# Patient Record
Sex: Male | Born: 1965
Health system: Southern US, Community
[De-identification: ages and names within clinical notes are randomized; demographics above are authoritative.]

## PROBLEM LIST (undated history)

## (undated) DIAGNOSIS — T7840XA Allergy, unspecified, initial encounter: Secondary | ICD-10-CM

## (undated) DIAGNOSIS — E781 Pure hyperglyceridemia: Secondary | ICD-10-CM

## (undated) DIAGNOSIS — R739 Hyperglycemia, unspecified: Secondary | ICD-10-CM

## (undated) DIAGNOSIS — E119 Type 2 diabetes mellitus without complications: Secondary | ICD-10-CM

## (undated) HISTORY — DX: Allergy, unspecified, initial encounter: T78.40XA

## (undated) HISTORY — PX: EYE SURGERY: SHX253

## (undated) HISTORY — DX: Hyperglycemia, unspecified: R73.9

## (undated) HISTORY — DX: Type 2 diabetes mellitus without complications: E11.9

## (undated) HISTORY — DX: Pure hyperglyceridemia: E78.1

---
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
## 2009-07-19 ENCOUNTER — Encounter (INDEPENDENT_AMBULATORY_CARE_PROVIDER_SITE_OTHER): Payer: Self-pay | Admitting: *Deleted

## 2009-08-05 ENCOUNTER — Ambulatory Visit: Payer: Self-pay | Admitting: Internal Medicine

## 2009-08-05 DIAGNOSIS — E781 Pure hyperglyceridemia: Secondary | ICD-10-CM | POA: Insufficient documentation

## 2009-08-09 LAB — CONVERTED CEMR LAB
ALT: 26 units/L (ref 0–53)
AST: 25 units/L (ref 0–37)
Albumin: 4.2 g/dL (ref 3.5–5.2)
Alkaline Phosphatase: 84 units/L (ref 39–117)
BUN: 18 mg/dL (ref 6–23)
Basophils Absolute: 0.1 10*3/uL (ref 0.0–0.1)
Basophils Relative: 3 % (ref 0.0–3.0)
Bilirubin, Direct: 0 mg/dL (ref 0.0–0.3)
CO2: 29 meq/L (ref 19–32)
Calcium: 9.1 mg/dL (ref 8.4–10.5)
Chloride: 101 meq/L (ref 96–112)
Cholesterol: 250 mg/dL — ABNORMAL HIGH (ref 0–200)
Creatinine, Ser: 1 mg/dL (ref 0.4–1.5)
Direct LDL: 77 mg/dL
Eosinophils Absolute: 0.2 10*3/uL (ref 0.0–0.7)
Eosinophils Relative: 4.1 % (ref 0.0–5.0)
GFR calc non Af Amer: 87.55 mL/min (ref >60)
Glucose, Bld: 87 mg/dL (ref 70–99)
HCT: 45.9 % (ref 39.0–52.0)
HDL: 37.5 mg/dL — ABNORMAL LOW (ref >39.00)
Hemoglobin: 15.6 g/dL (ref 13.0–17.0)
Lymphocytes Relative: 51.9 % — ABNORMAL HIGH (ref 12.0–46.0)
Lymphs Abs: 2 10*3/uL (ref 0.7–4.0)
MCHC: 34 g/dL (ref 30.0–36.0)
MCV: 91.5 fL (ref 78.0–100.0)
Monocytes Absolute: 0.8 10*3/uL (ref 0.1–1.0)
Monocytes Relative: 19.9 % — ABNORMAL HIGH (ref 3.0–12.0)
Neutro Abs: 0.8 10*3/uL — ABNORMAL LOW (ref 1.4–7.7)
Neutrophils Relative %: 21.1 % — ABNORMAL LOW (ref 43.0–77.0)
Platelets: 158 10*3/uL (ref 150.0–400.0)
Potassium: 4.2 meq/L (ref 3.5–5.1)
RBC: 5.01 M/uL (ref 4.22–5.81)
RDW: 11.9 % (ref 11.5–14.6)
Sodium: 136 meq/L (ref 135–145)
TSH: 0.9 microintl units/mL (ref 0.35–5.50)
Total Bilirubin: 1 mg/dL (ref 0.3–1.2)
Total CHOL/HDL Ratio: 7
Total Protein: 7.3 g/dL (ref 6.0–8.3)
Triglycerides: 708 mg/dL — ABNORMAL HIGH (ref 0.0–149.0)
VLDL: 141.6 mg/dL — ABNORMAL HIGH (ref 0.0–40.0)
WBC: 3.9 10*3/uL — ABNORMAL LOW (ref 4.5–10.5)

## 2009-12-24 ENCOUNTER — Telehealth (INDEPENDENT_AMBULATORY_CARE_PROVIDER_SITE_OTHER): Payer: Self-pay | Admitting: *Deleted

## 2009-12-30 ENCOUNTER — Encounter: Payer: Self-pay | Admitting: Internal Medicine

## 2010-08-13 ENCOUNTER — Encounter: Payer: Self-pay | Admitting: Internal Medicine

## 2010-08-13 ENCOUNTER — Ambulatory Visit: Payer: Self-pay | Admitting: Internal Medicine

## 2010-08-19 LAB — CONVERTED CEMR LAB
ALT: 32 units/L (ref 0–53)
AST: 25 units/L (ref 0–37)
BUN: 16 mg/dL (ref 6–23)
Basophils Absolute: 0 10*3/uL (ref 0.0–0.1)
Basophils Relative: 0.7 % (ref 0.0–3.0)
CO2: 28 meq/L (ref 19–32)
Calcium: 9.7 mg/dL (ref 8.4–10.5)
Chloride: 102 meq/L (ref 96–112)
Creatinine, Ser: 0.9 mg/dL (ref 0.4–1.5)
Eosinophils Absolute: 0.2 10*3/uL (ref 0.0–0.7)
Eosinophils Relative: 3.9 % (ref 0.0–5.0)
GFR calc non Af Amer: 105.09 mL/min (ref >60)
Glucose, Bld: 93 mg/dL (ref 70–99)
HCT: 46.1 % (ref 39.0–52.0)
Hemoglobin: 16.2 g/dL (ref 13.0–17.0)
Lymphocytes Relative: 52.5 % — ABNORMAL HIGH (ref 12.0–46.0)
Lymphs Abs: 2.6 10*3/uL (ref 0.7–4.0)
MCHC: 35.2 g/dL (ref 30.0–36.0)
MCV: 88.3 fL (ref 78.0–100.0)
Monocytes Absolute: 0.6 10*3/uL (ref 0.1–1.0)
Monocytes Relative: 13.1 % — ABNORMAL HIGH (ref 3.0–12.0)
Neutro Abs: 1.5 10*3/uL (ref 1.4–7.7)
Neutrophils Relative %: 29.8 % — ABNORMAL LOW (ref 43.0–77.0)
Platelets: 188 10*3/uL (ref 150.0–400.0)
Potassium: 4.3 meq/L (ref 3.5–5.1)
RBC: 5.22 M/uL (ref 4.22–5.81)
RDW: 12.7 % (ref 11.5–14.6)
Sodium: 138 meq/L (ref 135–145)
WBC: 4.9 10*3/uL (ref 4.5–10.5)

## 2010-09-02 ENCOUNTER — Ambulatory Visit: Payer: Self-pay

## 2010-09-02 ENCOUNTER — Ambulatory Visit: Payer: Self-pay | Admitting: Cardiology

## 2010-10-05 HISTORY — PX: VASECTOMY: SHX75

## 2010-10-10 ENCOUNTER — Telehealth: Payer: Self-pay | Admitting: Internal Medicine

## 2010-10-22 ENCOUNTER — Telehealth: Payer: Self-pay | Admitting: Internal Medicine

## 2010-10-23 ENCOUNTER — Ambulatory Visit
Admission: RE | Admit: 2010-10-23 | Discharge: 2010-10-23 | Payer: Self-pay | Source: Home / Self Care | Attending: Internal Medicine | Admitting: Internal Medicine

## 2010-10-23 ENCOUNTER — Other Ambulatory Visit: Payer: Self-pay | Admitting: Internal Medicine

## 2010-10-23 LAB — LIPID PANEL
Cholesterol: 249 mg/dL — ABNORMAL HIGH (ref 0–200)
HDL: 40.8 mg/dL (ref >39.00)
Total CHOL/HDL Ratio: 6
Triglycerides: 249 mg/dL — ABNORMAL HIGH (ref 0.0–149.0)
VLDL: 49.8 mg/dL — ABNORMAL HIGH (ref 0.0–40.0)

## 2010-10-23 LAB — LDL CHOLESTEROL, DIRECT: Direct LDL: 166.5 mg/dL

## 2010-10-23 LAB — ALT: ALT: 19 U/L (ref 0–53)

## 2010-10-23 LAB — AST: AST: 19 U/L (ref 0–37)

## 2010-11-04 NOTE — Assessment & Plan Note (Signed)
Summary: CPX/drb   Vital Signs:  Patient profile:   45 year old male Height:      69 inches Weight:      206.25 pounds BMI:     30.57 Pulse rate:   73 / minute Pulse rhythm:   regular BP sitting:   126 / 76  (left arm) Cuff size:   regular  Vitals Entered By: Army Fossa CMA (August 13, 2010 3:59 PM) CC: CPX Comments Td,flu shot today [facility] [employer]   History of Present Illness: CPX doing well   Current Medications (verified): 1)  Aspirin 81 Mg Tbec (Aspirin) .Marland Kitchen.. 1 A Day  Allergies (verified): No Known Drug Allergies  Past History:  Past Medical History: Reviewed history from 08/05/2009 and no changes required. hx of high TG, up to 900s - was treated with vytorin (d/c  when he was training for marathon & TG decreased)  Past Surgical History: Reviewed history from 08/05/2009 and no changes required. Tonsillectomy (1976)  Family History: Reviewed history from 08/05/2009 and no changes required. M - deceased intestinal infection, MI colon Ca - no prostate Ca - no Aunt deceased Ca - breast? elevated chol - parents, GP CAD  - M , F (age 55, MI), GP stroke - GP  Social History: Reviewed history from 08/05/2009 and no changes required. Married 2 children from [place]  Former Smoker Alcohol use-yes Drug use-no Regular exercise-yes needs to improve diet  Review of Systems General:  Denies fatigue and fever; wt has increase and decreased throughout the year. CV:  Denies chest pain or discomfort and swelling of feet. Resp:  Denies cough and shortness of breath. GI:  Denies bloody stools, diarrhea, nausea, and vomiting. GU:  Denies dysuria and hematuria. Psych:  Denies anxiety and depression.  Physical Exam  General:  alert, well-developed, and well-nourished.   Neck:  no thyromegaly.   Lungs:  normal respiratory effort, no intercostal retractions, no accessory muscle use, and normal breath sounds.   Heart:  normal rate, regular rhythm, and no  murmur.   Abdomen:  soft, non-tender, normal bowel sounds, no distention, no masses, and no guarding.   Extremities:  no pretibial edema bilaterally  Psych:  Oriented X3, memory intact for recent and remote, normally interactive, good eye contact, not anxious appearing, and not depressed appearing.     Impression & Recommendations:  Problem # 1:  HEALTH SCREENING (ICD-V70.0) Td today flu shot today diet-exercise discussed  labs  EKG--nsr  strong FH CAD and elevated TG--- patient request a stress test , will do  Orders: Venipuncture (96045) Specimen Handling (40981) T-NMR, Lipoprofile (19147-82956) TLB-CBC Platelet - w/Differential (85025-CBCD) TLB-BMP (Basic Metabolic Panel-BMET) (80048-METABOL) TLB-ALT (SGPT) (84460-ALT) TLB-AST (SGOT) (84450-SGOT) EKG w/ Interpretation (93000)  Problem # 2:  FAMILY HISTORY OF ISCHEMIC HEART DISEASE (ICD-V17.3) see #1   Orders: Cardiology Referral (Cardiology)  Problem # 3:  HYPERTRIGLYCERIDEMIA (ICD-272.1) patient took fenofibrate x few weeks after last OV (2010) w/o problems but self d/c now has been on vytorin 10-20 (leftover from previous doctor) x several months, takes  ~ 4 times a week d/w patient that -- TG at that level increase risk of CAD, pancreatitis. --Diet-exercise will help decrease TG --excessive ETOH will increase TG NMR ordered consider referal to the cholesterol clinic  The following medications were removed from the medication list:    Lipofen 150 Mg Caps (Fenofibrate) ..... One by mouth daily His updated medication list for this problem includes:    Vytorin 10-20 Mg Tabs (  Ezetimibe-simvastatin) .Marland Kitchen... 1 a day  Complete Medication List: 1)  Aspirin 81 Mg Tbec (Aspirin) .Marland Kitchen.. 1 a day 2)  Vytorin 10-20 Mg Tabs (Ezetimibe-simvastatin) .Marland Kitchen.. 1 a day  Other Orders: Admin 1st Vaccine (44034) Flu Vaccine 43yrs + (74259) Tdap => 19yrs IM (56387) Admin of Any Addtl Vaccine (56433)  Patient Instructions: 1)  Please  schedule a follow-up appointment in 6 months .  Prescriptions: VYTORIN 10-20 MG TABS (EZETIMIBE-SIMVASTATIN) 1 a day  #90 x 1   Entered and Authorized by:   Jose E. Paz MD   Signed by:   Nolon Rod. Paz MD on 08/13/2010   Method used:   Print then Give to Patient   RxID:   [id number]    Orders Added: 1)  Venipuncture [01093] 2)  Specimen Handling [99000] 3)  T-NMR, Lipoprofile [[id number]] 4)  TLB-CBC Platelet - w/Differential [85025-CBCD] 5)  TLB-BMP (Basic Metabolic Panel-BMET) [80048-METABOL] 6)  TLB-ALT (SGPT) [84460-ALT] 7)  TLB-AST (SGOT) [84450-SGOT] 8)  Admin 1st Vaccine [90471] 9)  Flu Vaccine 22yrs + [90658] 10)  Tdap => 5yrs IM [90715] 11)  Admin of Any Addtl Vaccine [90472] 12)  EKG w/ Interpretation [93000] 13)  Cardiology Referral [Cardiology] 14)  Est. Patient age 60-64 [61]  Flu Vaccine Consent Questions     Do you have a history of severe allergic reactions to this vaccine? no    Any prior history of allergic reactions to egg and/or gelatin? no    Do you have a sensitivity to the preservative Thimersol? no    Do you have a past history of Guillan-Barre Syndrome? no    Do you currently have an acute febrile illness? no    Have you ever had a severe reaction to latex? no    Vaccine information given and explained to patient? yes    Are you currently pregnant? no    Lot Number:[id number]   Exp Date:04/04/2011   Site Given  Left Deltoid IM  Immunizations Administered:  Tetanus Vaccine:    Vaccine Type: Tdap    Site: right deltoid    Mfr: GlaxoSmithKline    Dose: 0.5 ml    Route: IM    Given by: Army Fossa CMA    Exp. Date: 07/24/2012    Lot #: [id number]   Immunizations Administered:  Tetanus Vaccine:    Vaccine Type: Tdap    Site: right deltoid    Mfr: GlaxoSmithKline    Dose: 0.5 ml    Route: IM    Given by: Army Fossa CMA    Exp. Date: 07/24/2012    Lot #: [id number]   .lbflu1    Risk Factors:  Alcohol use:   yes

## 2010-11-04 NOTE — Progress Notes (Signed)
----   Converted from flag ---- ---- 12/24/2009 9:32 AM, Elita Quick E. Paz MD wrote: please send a referral to ophthalmology, Dr. Hazle Quant office  dx : Eye discomfort history of previous eye  surgery just  send referral, patient will call and schedule ------------------------------

## 2010-11-04 NOTE — Consult Note (Signed)
Summary: [facility]   Imported By: Lanelle Bal 01/08/2010 12:06:16  _____________________________________________________________________  External Attachment:    Type:   Image     Comment:   External Document

## 2010-11-06 NOTE — Progress Notes (Signed)
Summary: call in lipofen 150  Phone Note Outgoing Call   Summary of Call: spoke with patient, aware he needs a followup  FLP, AST ALT. Patient will schedule please  call lipofen 150mg  once daily #30, 2 RFs to [facility] [address] [employer]. Zykeria Laguardia E. Chayna Surratt MD  October 10, 2010 12:34 PM        New/Updated Medications: LIPOFEN 150 MG CAPS (FENOFIBRATE) 1 by mouth daily. Prescriptions: LIPOFEN 150 MG CAPS (FENOFIBRATE) 1 by mouth daily.  #30 x 2   Entered by:   Army Fossa CMA   Authorized by:   Nolon Rod. Selwyn Reason MD   Signed by:   Army Fossa CMA on 10/10/2010   Method used:   Electronically to        [facility]. [address] [phone]* (retail)       [address]       [place], [place]  [address]       Ph: [phone] or [phone]       Fax: [phone]   RxID:   [id number]

## 2010-11-06 NOTE — Progress Notes (Signed)
Summary: labs  Phone Note Outgoing Call   Summary of Call: patient due for a FLP, AST, ALT. He will call shortly and get an appointment for blood work only. DX hyperlipidemia Dan Scearce E. Meliyah Simon MD  October 22, 2010 3:01 PM      Appended Document: labs Patient called back, patient will have labs tomorrow at [facility]

## 2011-01-23 ENCOUNTER — Other Ambulatory Visit: Payer: Self-pay | Admitting: Internal Medicine

## 2011-01-23 MED ORDER — FENOFIBRATE 150 MG PO CAPS: ORAL_CAPSULE | ORAL | Status: DC

## 2011-01-23 NOTE — Telephone Encounter (Signed)
Please send a refill for lipofen , #30, 3 RF. He will later on call for blood work: FLP, AST, ALT, DX hyperlipidemia [facility]

## 2011-04-03 ENCOUNTER — Telehealth: Payer: Self-pay | Admitting: Internal Medicine

## 2011-04-03 DIAGNOSIS — L282 Other prurigo: Secondary | ICD-10-CM

## 2011-04-03 NOTE — Telephone Encounter (Signed)
Spoke w/ pt, has a quite pruritic ras, Px prednisone , needs to see dem Please arrange dermatology referral, see if they can see him next week

## 2011-04-14 ENCOUNTER — Encounter: Payer: Self-pay | Admitting: Physician Assistant

## 2011-07-08 ENCOUNTER — Encounter: Payer: Self-pay | Admitting: Internal Medicine

## 2011-07-08 ENCOUNTER — Ambulatory Visit (INDEPENDENT_AMBULATORY_CARE_PROVIDER_SITE_OTHER): Payer: PRIVATE HEALTH INSURANCE | Admitting: Internal Medicine

## 2011-07-08 DIAGNOSIS — K649 Unspecified hemorrhoids: Secondary | ICD-10-CM | POA: Insufficient documentation

## 2011-07-08 DIAGNOSIS — E781 Pure hyperglyceridemia: Secondary | ICD-10-CM

## 2011-07-08 LAB — LIPID PANEL: Cholesterol: 211 mg/dL — ABNORMAL HIGH (ref 0–200)

## 2011-07-08 NOTE — Assessment & Plan Note (Signed)
Patient has an asymptomatic hemorrhoid, recommend to continue with a healthy diet (he eats lots of fruits and vegetables), also Metamucil 2 capsules a day, hydrocortisone 1% over-the-counter cream if needed. If he developed pain-swelling : he needs to let me know.

## 2011-07-08 NOTE — Progress Notes (Signed)
  Subjective:    Patient ID: Robert Soto, male    DOB: 1966/01/23, 45 y.o.   MRN: [id number]  HPI Acute office visit 10 days ago noticed a "knot" at the anus. Denies any pain, bleeding or discharge in the area. Also would like his cholesterol to be checked  Past Medical History  Diagnosis Date  . High triglycerides     hx. up to 900s- was treated with vytorin (d/c when he was training for marathon & TG decreased)   Past Surgical History  Procedure Date  . Tonsillectomy 1976    Review of Systems No abdominal pain, nausea, vomiting. No constipation. Patient is training for a marathon, has noted his stools to be smaller than usual.     Objective:   Physical Exam  Constitutional: He is oriented to person, place, and time. He appears well-developed and well-nourished. No distress.  Genitourinary:       External inspection shows a 1 cm, soft, nontender hemorrhoid at around 6:00. Few skin tags as well. Digital rectal exam with normal rectum and prostate  Musculoskeletal: He exhibits no edema.  Neurological: He is alert and oriented to person, place, and time.  Skin: He is not diaphoretic.          Assessment & Plan:  Visit was performed when the computers were down, documentation entered after the visit

## 2011-07-08 NOTE — Assessment & Plan Note (Addendum)
Currently on no medication, he is training for a marathon, doing an excellent diet and a lot of exercise, run 20 miles yesterday. Labs

## 2011-08-25 ENCOUNTER — Telehealth: Payer: Self-pay | Admitting: Internal Medicine

## 2011-08-25 DIAGNOSIS — E781 Pure hyperglyceridemia: Secondary | ICD-10-CM

## 2011-08-25 NOTE — Telephone Encounter (Signed)
Patient would like to be evaluated  at the lipid clinic, referral made

## 2011-08-31 ENCOUNTER — Ambulatory Visit: Payer: PRIVATE HEALTH INSURANCE

## 2011-09-04 ENCOUNTER — Ambulatory Visit (INDEPENDENT_AMBULATORY_CARE_PROVIDER_SITE_OTHER): Payer: PRIVATE HEALTH INSURANCE | Admitting: Internal Medicine

## 2011-09-04 ENCOUNTER — Encounter: Payer: Self-pay | Admitting: Internal Medicine

## 2011-09-04 VITALS — BP 120/84 | HR 77 | Temp 98.6°F | Wt 198.0 lb

## 2011-09-04 DIAGNOSIS — R059 Cough, unspecified: Secondary | ICD-10-CM

## 2011-09-04 DIAGNOSIS — R05 Cough: Secondary | ICD-10-CM

## 2011-09-04 DIAGNOSIS — B349 Viral infection, unspecified: Secondary | ICD-10-CM

## 2011-09-04 MED ORDER — AZITHROMYCIN 250 MG PO TABS: ORAL_TABLET | ORAL | Status: AC

## 2011-09-04 NOTE — Progress Notes (Signed)
  Subjective:    Patient ID: Robert Soto, male    DOB: [birth year], 45 y.o.   MRN: [id number]  HPI Symptoms is that of 5 days ago, he is coughing  abundant green sputum. Is taking ibuprofen and Robitussin. He had to skip work a couple of days this week  Past Medical History  Diagnosis Date  . High triglycerides     hx. up to 900s- was treated with vytorin (d/c when he was training for marathon & TG decreased)     Review of Systems Denies fever or chills No nausea vomiting, some diarrhea yesterday. Some myalgias, feeling fatigue, mild global headache. Mild sore throat    Objective:   Physical Exam  Constitutional: He is oriented to person, place, and time. He appears well-developed and well-nourished.  HENT:  Head: Normocephalic and atraumatic.  Right Ear: External ear normal.  Left Ear: External ear normal.  Nose: Nose normal.       Face is symmetric, not tender to palpation. Throat without redness or discharge, it looks symmetric  Cardiovascular: Normal rate, regular rhythm and normal heart sounds.   No murmur heard. Pulmonary/Chest: Effort normal and breath sounds normal. No respiratory distress. He has no wheezes. He has no rales.  Musculoskeletal: He exhibits no edema.  Neurological: He is alert and oriented to person, place, and time.      Assessment & Plan:  Patient presents with what seems to be a viral syndrome complicated with bronchitis. See instructions

## 2011-09-04 NOTE — Patient Instructions (Signed)
Rest, fluids , tylenol For cough, take Mucinex DM twice a day as needed  For congestion use Sudafed (pseudoephedrine) behind the counter 30 mg every 4 to 6 hours as needed Take the antibiotic as prescribed ----> zithromax Call if no better in few days Call anytime if the symptoms are severe, you have high fever

## 2011-09-21 ENCOUNTER — Ambulatory Visit (INDEPENDENT_AMBULATORY_CARE_PROVIDER_SITE_OTHER): Payer: PRIVATE HEALTH INSURANCE | Admitting: Pharmacist

## 2011-09-21 DIAGNOSIS — E781 Pure hyperglyceridemia: Secondary | ICD-10-CM

## 2011-09-22 ENCOUNTER — Other Ambulatory Visit: Payer: PRIVATE HEALTH INSURANCE

## 2011-09-22 DIAGNOSIS — E781 Pure hyperglyceridemia: Secondary | ICD-10-CM

## 2011-09-24 LAB — NMR LIPOPROFILE WITH LIPIDS
Cholesterol, Total: 189 mg/dL (ref <200)
HDL Particle Number: 31.4 umol/L (ref >=30.5)
HDL-C: 44 mg/dL (ref >=40)
Triglycerides: 573 mg/dL — ABNORMAL HIGH (ref <150)

## 2011-09-24 NOTE — Progress Notes (Signed)
HPI [name] is a 45 yo M who presents to Lipid Clinic for initial evaluation.  He requested referral due to his longstanding history of hypertriglyceridemia as well as a strong family history of CAD.  His last lipid panel was normal, but he brought a copy of his labwork over the past 20 years for review.  During this time, his TG have fluctuated from 100s to 700s.  This fluctuation is usually related to his training schedule.  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  He has tried Lipofen and Vytorin in the past to treat his TG.  Neither caused any problems and did have an effect on his cholesterol, he just does not like to stay on medication long term.  He is currently taking 1 fish oil daily.   Reviewed his family history.  His father died of MI in his 38s.  Mother died of MI likely secondary to infection at 42.  His brother was diagnosed with hyperlipidemia at age 21.  His dad was 1 of 12 children and all of his siblings have either had an MI or have heart disease.  Pt does have 2 young children.  They have both had their cholesterol tested and it is normal.    Pt does not smoke.  He does drink wine or scotch socially.  He does a lot of international travel for his job.    Review of diet shows pt does a fairly decent job at controlling his diet.  For breakfast he will have oatmeal and a banana.  During the morning, he may snack on a breakfast bar.  He often brings lunch from home but will have the occasional business lunch to attend.  For dinner, he will have fish 1-2 days per week, chicken, salads, and occasional rice.  His children do like to have pizza every Friday night.    Exercise consists of training for marathons.  Right now, pt is not training so he does not run on a daily basis.   Current Outpatient Prescriptions  Medication Sig Dispense Refill  . fish oil-omega-3 fatty acids 1000 MG capsule Take 1 g by mouth daily.          No Known Allergies

## 2011-09-28 NOTE — Assessment & Plan Note (Signed)
Pt's cholesterol controlled in October, but repeat test in December showed increased TG (573- goal<150).  We checked lipoprofile to help determine if we should treat with Niacin or fibrate.  LDL- P- 2822 (goal<1000) and LDL size was 19.5- which represents small particle size.  Given increase in LDL-P, will start Niacin 500mg  once daily rather than a fibrate.  Will f/u in 1-2 months.

## 2011-11-18 ENCOUNTER — Other Ambulatory Visit: Payer: Self-pay | Admitting: Pharmacist

## 2011-11-18 DIAGNOSIS — Z7901 Long term (current) use of anticoagulants: Secondary | ICD-10-CM

## 2011-11-18 DIAGNOSIS — E785 Hyperlipidemia, unspecified: Secondary | ICD-10-CM

## 2011-12-09 ENCOUNTER — Other Ambulatory Visit (INDEPENDENT_AMBULATORY_CARE_PROVIDER_SITE_OTHER): Payer: PRIVATE HEALTH INSURANCE

## 2011-12-09 DIAGNOSIS — E785 Hyperlipidemia, unspecified: Secondary | ICD-10-CM

## 2011-12-09 DIAGNOSIS — Z7901 Long term (current) use of anticoagulants: Secondary | ICD-10-CM

## 2011-12-09 LAB — HEPATIC FUNCTION PANEL
ALT: 24 U/L (ref 0–53)
Albumin: 4.3 g/dL (ref 3.5–5.2)
Alkaline Phosphatase: 68 U/L (ref 39–117)
Total Protein: 7.3 g/dL (ref 6.0–8.3)

## 2011-12-12 LAB — NMR LIPOPROFILE WITH LIPIDS
Cholesterol, Total: 274 mg/dL — ABNORMAL HIGH (ref <200)
HDL-C: 37 mg/dL — ABNORMAL LOW (ref >=40)
LDL Size: 20.4 nm — ABNORMAL LOW (ref >20.5)
Small LDL Particle Number: 1168 nmol/L — ABNORMAL HIGH (ref <=527)
Triglycerides: 1156 mg/dL — ABNORMAL HIGH (ref <150)

## 2011-12-17 ENCOUNTER — Ambulatory Visit (INDEPENDENT_AMBULATORY_CARE_PROVIDER_SITE_OTHER): Payer: PRIVATE HEALTH INSURANCE | Admitting: Pharmacist

## 2011-12-17 DIAGNOSIS — E781 Pure hyperglyceridemia: Secondary | ICD-10-CM

## 2011-12-17 MED ORDER — NIACIN ER (ANTIHYPERLIPIDEMIC) 1000 MG PO TBCR: 1000.0000 mg | EXTENDED_RELEASE_TABLET | Freq: Every day | ORAL | Status: DC

## 2011-12-17 MED ORDER — FENOFIBRATE 150 MG PO CAPS: 150.0000 mg | ORAL_CAPSULE | Freq: Every day | ORAL | Status: DC

## 2011-12-17 MED ORDER — FENOFIBRATE 150 MG PO CAPS
1.0000 | ORAL_CAPSULE | Freq: Every day | ORAL | Status: DC
Start: 2011-12-17 — End: 2011-12-17

## 2011-12-17 NOTE — Progress Notes (Signed)
Subjective: Robert Soto presented in good spirits today for 3 month follow up on his lipids.  He has a history of fluctuating TG (100-700s) related to his marathon training schedule. 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  At his last visit, he was started on Niaspan 500mg  daily, and continues this without reports of side effects.  Of note, he has significant family history of CVD in both parents.  Diet - He continues to maintain a healthy diet, but occasionally attends business lunches and dinners.  He avoids fried foods and tries to have salad with each meal.  Exercise - He is currently not training for any marathons, but is hoping to get more active in the spring.  His "off season" schedule is usually to run 3 times/week.  NMR Lipoprofile LDL-P  1575 (12/09/11) HDL-C  37 (12/09/11)  TG  1156 (12/09/11) TC  274 (12/09/11) LDL Size 20.4 (12/09/11)   Current Outpatient Prescriptions  Medication Sig Dispense Refill  . fish oil-omega-3 fatty acids 1000 MG capsule Take 1-2 g by mouth daily.       . niacin (NIASPAN) 500 MG CR tablet Take 500 mg by mouth at bedtime.

## 2011-12-17 NOTE — Assessment & Plan Note (Addendum)
CV Risk Assessment  Risk Factors: Family history of MI in mother & father, low HDL LDL Goal<130, HDL Goal>40, TG Goal<150, ZOX-W<9604  Recommendations and Plan  1. Increase Niaspan to 1000mg  once daily.   2. Start taking Lipofen 150mg  once daily. 3. Continue to maintain a heart healthy diet. 4. Follow-up for labwork and visit in 6 weeks.

## 2011-12-17 NOTE — Patient Instructions (Addendum)
Increase Niaspan to 1000mg  once daily.    Start taking Lipofen 150mg  once daily.  Continue to maintain a heart healthy diet.  Follow-up for labwork and visit in 6 weeks.

## 2011-12-29 ENCOUNTER — Other Ambulatory Visit: Payer: Self-pay | Admitting: Pharmacist

## 2011-12-29 DIAGNOSIS — E781 Pure hyperglyceridemia: Secondary | ICD-10-CM

## 2012-01-27 ENCOUNTER — Other Ambulatory Visit: Payer: PRIVATE HEALTH INSURANCE

## 2012-01-28 ENCOUNTER — Ambulatory Visit: Payer: PRIVATE HEALTH INSURANCE

## 2012-02-01 ENCOUNTER — Other Ambulatory Visit (INDEPENDENT_AMBULATORY_CARE_PROVIDER_SITE_OTHER): Payer: PRIVATE HEALTH INSURANCE

## 2012-02-01 DIAGNOSIS — E781 Pure hyperglyceridemia: Secondary | ICD-10-CM

## 2012-02-01 LAB — HEPATIC FUNCTION PANEL
ALT: 20 U/L (ref 0–53)
AST: 22 U/L (ref 0–37)
Bilirubin, Direct: 0.1 mg/dL (ref 0.0–0.3)
Total Protein: 7.3 g/dL (ref 6.0–8.3)

## 2012-02-01 LAB — LIPID PANEL
HDL: 45.6 mg/dL (ref >39.00)
Triglycerides: 437 mg/dL — ABNORMAL HIGH (ref 0.0–149.0)

## 2012-02-02 ENCOUNTER — Ambulatory Visit (INDEPENDENT_AMBULATORY_CARE_PROVIDER_SITE_OTHER): Payer: PRIVATE HEALTH INSURANCE | Admitting: Pharmacist

## 2012-02-02 DIAGNOSIS — E785 Hyperlipidemia, unspecified: Secondary | ICD-10-CM

## 2012-02-02 DIAGNOSIS — E781 Pure hyperglyceridemia: Secondary | ICD-10-CM

## 2012-02-02 NOTE — Progress Notes (Signed)
Subjective: Robert Soto presented in for 1 month follow up on his lipids.  He has a history of fluctuating TG (100-700s). 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  He recently completed a 3 week stent of travel so admits that his diet and exercise routine have been the worst they have been in the past year.   At his last visit, he was started on fenofibrate and Niaspan was increased from 500mg  to 1000mg .  He has not been compliant with taking the fish oil every day.  Of note, he has significant family history of CVD in both parents.  Diet - He continues to maintain a healthy diet, but occasionally attends business lunches and dinners.  He avoids fried foods and tries to have salad with each meal.  Exercise - He is currently not training for any marathons, but is hoping to get more active in the spring.  His "off season" schedule is usually to run 3 times/week.    Current Outpatient Prescriptions  Medication Sig Dispense Refill  . Fenofibrate (LIPOFEN) 150 MG CAPS Take 1 capsule (150 mg total) by mouth daily.  30 each  6  . fish oil-omega-3 fatty acids 1000 MG capsule Take 1-2 g by mouth daily.       . niacin (NIASPAN) 1000 MG CR tablet Take 1 tablet (1,000 mg total) by mouth at bedtime.  30 tablet  6

## 2012-02-02 NOTE — Assessment & Plan Note (Signed)
Pt's TG improved with addition of fenofibrate.  TC- 252 (goal<200), TG- 437 (goal<150), HDL- 45 (goal>40), LDL- 124 (goal<130).  LFTs are WNL.  Will have patient increase fish oil to 4gm per day.  He is concerned over traveling and his TG increasing when traveling.  He is going to try to increase Niacin to 1500mg  daily.  If he can tolerate this, he will increase niacin while traveling then decrease back to 1000mg  daily while at home and able to run on a regular basis.  His goal is to weigh <190lb by next visit.  Will f/u in June after his next international travel trip.

## 2012-02-02 NOTE — Patient Instructions (Addendum)
Increase fish oil to 4 capsules per day.   Try to take 1500mg  of niacin this week.  If you can tolerate with no side effects, feel free to increase Niacin when you will be traveling and unable to eat your normal diet or exercise on a regular basis   We will recheck your labs in June.

## 2012-03-30 ENCOUNTER — Other Ambulatory Visit: Payer: PRIVATE HEALTH INSURANCE

## 2012-04-01 ENCOUNTER — Ambulatory Visit: Payer: PRIVATE HEALTH INSURANCE | Admitting: Pharmacist

## 2012-06-15 ENCOUNTER — Ambulatory Visit: Payer: PRIVATE HEALTH INSURANCE

## 2012-06-15 DIAGNOSIS — E785 Hyperlipidemia, unspecified: Secondary | ICD-10-CM

## 2012-06-15 DIAGNOSIS — Z79899 Other long term (current) drug therapy: Secondary | ICD-10-CM

## 2012-06-15 LAB — HEPATIC FUNCTION PANEL
ALT: 21 U/L (ref 0–53)
AST: 20 U/L (ref 0–37)
Albumin: 4.4 g/dL (ref 3.5–5.2)
Total Bilirubin: 0.7 mg/dL (ref 0.3–1.2)

## 2012-06-15 LAB — LIPID PANEL
Cholesterol: 259 mg/dL — ABNORMAL HIGH (ref 0–200)
Total CHOL/HDL Ratio: 5
Triglycerides: 331 mg/dL — ABNORMAL HIGH (ref 0.0–149.0)

## 2012-06-17 ENCOUNTER — Encounter: Payer: Self-pay | Admitting: Pharmacist

## 2012-06-17 ENCOUNTER — Ambulatory Visit (INDEPENDENT_AMBULATORY_CARE_PROVIDER_SITE_OTHER): Payer: PRIVATE HEALTH INSURANCE | Admitting: Pharmacist

## 2012-06-17 VITALS — Wt 205.0 lb

## 2012-06-17 DIAGNOSIS — E781 Pure hyperglyceridemia: Secondary | ICD-10-CM

## 2012-06-17 MED ORDER — FENOFIBRATE 160 MG PO TABS: 160.0000 mg | ORAL_TABLET | Freq: Every day | ORAL | Status: DC

## 2012-06-17 NOTE — Progress Notes (Signed)
Subjective: Mr. Raben presented in for 3 month follow up on his lipids.  He has a history of fluctuating TG (100-700s). 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  He has significant family history of CVD in both parents.  He is currently taking fenofibrate, fish oil, and Niaspan.  He did report a few incidences of flushing but only when he took the medication before bedtime.   Diet - He continues to maintain a healthy diet, but occasionally attends business lunches and dinners.  He avoids fried foods and tries to have salad with each meal.  Exercise - He is currently not training for any marathons, but wants to start training for smaller races (10k, 1/2 marathons).  He recently started a weight loss competition with a group at work and has lost several pounds just from portion control.    Current Outpatient Prescriptions  Medication Sig Dispense Refill  . niacin (NIASPAN) 1000 MG CR tablet Take 1 tablet (1,000 mg total) by mouth at bedtime.  30 tablet  6  . fenofibrate 160 MG tablet Take 1 tablet (160 mg total) by mouth daily. with food  90 tablet  3  . fish oil-omega-3 fatty acids 1000 MG capsule Take 1-2 g by mouth daily.       Marland Kitchen DISCONTD: Fenofibrate (LIPOFEN) 150 MG CAPS Take 1 capsule (150 mg total) by mouth daily.  30 each  6

## 2012-06-17 NOTE — Patient Instructions (Addendum)
Stop taking fish oil at this time  Start taking the generic fenofibrate once a day with a meal  You can start taking a baby aspirin 30 minutes before the niacin to decrease the flushing  Continue a healthy diet and exercise regimen

## 2012-06-20 NOTE — Assessment & Plan Note (Signed)
Pt's TG improved but LDL increased. TC- 259 (goal<200), TG-331 (goal<150), HDL- 48 (goal>40), LDL- 143 (goal<130).  LFTs are WNL.  Will continue current medications.  Pt asked for cheaper alternatives so will change to generic fenofibrate.  Suggested trying ASA 81mg  30 min prior to Niacin to prevent flushing.  As patient increases his activity level, will hope that TG and LDL will improve.  Will follow up in 6-8 months.

## 2012-11-19 ENCOUNTER — Telehealth: Payer: Self-pay | Admitting: Internal Medicine

## 2012-11-21 NOTE — Telephone Encounter (Signed)
Pt has not been seen within a year. OK to refill? 

## 2012-11-21 NOTE — Telephone Encounter (Signed)
I called in the RF during the weekend,#30 x 12; he sees the lipid clinic

## 2012-12-19 ENCOUNTER — Telehealth: Payer: Self-pay | Admitting: Internal Medicine

## 2012-12-19 MED ORDER — AMOXICILLIN-POT CLAVULANATE 875-125 MG PO TABS: 1.0000 | ORAL_TABLET | Freq: Two times a day (BID) | ORAL | Status: DC

## 2012-12-19 NOTE — Telephone Encounter (Signed)
Phone call from patient: Was taking at bike trip yesterday near [place],  [place] and a dog bite him in two other people. The wound is looking fine but he's still concerned. The dog has a owner, last rabies shot 10-2010. [place] Animal control is taking care of the dog x the next few days. Plan: Antibiotics for 10 days We'll discuss with ID, further treatment needed

## 2012-12-21 NOTE — Telephone Encounter (Signed)
Spoke w/ ID, appreciate her input: ID agrees w/ no rabies vaccine as long as the dog remains healthy x 10 days. Will communicate this info to the patient and advise him to check on the dog in the next 10 days

## 2012-12-29 ENCOUNTER — Encounter: Payer: Self-pay | Admitting: Pharmacist

## 2013-02-09 ENCOUNTER — Telehealth: Payer: Self-pay | Admitting: Internal Medicine

## 2013-04-20 NOTE — Telephone Encounter (Signed)
Error

## 2013-05-03 ENCOUNTER — Telehealth: Payer: Self-pay | Admitting: *Deleted

## 2013-05-03 DIAGNOSIS — Z0000 Encounter for general adult medical examination without abnormal findings: Secondary | ICD-10-CM

## 2013-05-03 NOTE — Telephone Encounter (Signed)
Orders placed.

## 2013-05-03 NOTE — Telephone Encounter (Signed)
Message copied by Shirlee More I on Wed May 03, 2013  1:20 PM ------      Message from: Willow Ora E      Created: Wed May 03, 2013 12:52 PM      Regarding: please enter orders        Please enter the following orders:      CMP, CBC, TSH, FLP--- dx V70      To be done tomorrow at the [facility] office  ------

## 2013-05-04 ENCOUNTER — Other Ambulatory Visit (INDEPENDENT_AMBULATORY_CARE_PROVIDER_SITE_OTHER): Payer: Managed Care, Other (non HMO)

## 2013-05-04 ENCOUNTER — Telehealth: Payer: Self-pay | Admitting: *Deleted

## 2013-05-04 DIAGNOSIS — Z0000 Encounter for general adult medical examination without abnormal findings: Secondary | ICD-10-CM

## 2013-05-04 LAB — LIPID PANEL
Cholesterol: 283 mg/dL — ABNORMAL HIGH (ref 0–200)
HDL: 41.9 mg/dL (ref >39.00)
Total CHOL/HDL Ratio: 7
Triglycerides: 530 mg/dL — ABNORMAL HIGH (ref 0.0–149.0)
VLDL: 106 mg/dL — ABNORMAL HIGH (ref 0.0–40.0)

## 2013-05-04 LAB — COMPREHENSIVE METABOLIC PANEL
AST: 21 U/L (ref 0–37)
Albumin: 4.4 g/dL (ref 3.5–5.2)
BUN: 21 mg/dL (ref 6–23)
CO2: 25 mEq/L (ref 19–32)
Calcium: 9.7 mg/dL (ref 8.4–10.5)
Chloride: 104 mEq/L (ref 96–112)
Creatinine, Ser: 1 mg/dL (ref 0.4–1.5)
GFR: 88.04 mL/min (ref >60.00)
Glucose, Bld: 103 mg/dL — ABNORMAL HIGH (ref 70–99)
Potassium: 4.3 mEq/L (ref 3.5–5.1)

## 2013-05-04 LAB — CBC
Platelets: 279 10*3/uL (ref 150.0–400.0)
RBC: 5.07 Mil/uL (ref 4.22–5.81)
WBC: 8.8 10*3/uL (ref 4.5–10.5)

## 2013-05-04 LAB — TSH: TSH: 1.29 u[IU]/mL (ref 0.35–5.50)

## 2013-05-04 NOTE — Telephone Encounter (Signed)
[facility] Triage Call Report Triage Record Num: [id number] Operator: Chevis Pretty Patient Name: Robert Soto Call Date & Time: 05/03/2013 3:33:34PM Patient Phone: [phone] PCP: Nolon Rod. Paz Patient Gender: Male PCP Fax : Patient DOB: 1966-07-06 Practice Name: [facility] Reason for Call: Caller: [name]/Patient; PCP: Other; CB#: [phone]; Call regarding Patient Calling To See If Labwork Ordered for 05/04/13 At [facility]. Per Epic, labwork ordered; patient will come in for fasting labs in AM 05/04/13. krs/can Protocol(s) Used: Office Note Recommended Outcome per Protocol: Information Noted and Sent to Office Reason for Outcome: Caller information to office Care Advice: ~ 07/

## 2013-05-05 ENCOUNTER — Ambulatory Visit (INDEPENDENT_AMBULATORY_CARE_PROVIDER_SITE_OTHER): Payer: Managed Care, Other (non HMO) | Admitting: Internal Medicine

## 2013-05-05 ENCOUNTER — Encounter: Payer: Self-pay | Admitting: Internal Medicine

## 2013-05-05 VITALS — BP 115/80 | HR 56 | Temp 97.8°F | Wt 213.8 lb

## 2013-05-05 DIAGNOSIS — Z0000 Encounter for general adult medical examination without abnormal findings: Secondary | ICD-10-CM | POA: Insufficient documentation

## 2013-05-05 DIAGNOSIS — E781 Pure hyperglyceridemia: Secondary | ICD-10-CM

## 2013-05-05 NOTE — Assessment & Plan Note (Addendum)
previous FLPs reviewed with the patient, he has been taking fenofibrate and niacin consistently, ran out of fish oil about 2 weeks ago. He has a ++ family history of heart disease, his triglycerides are elevated despite medication, LDL is ~140. I recommend LDL goal 100. Will discuss with the chol clinic and contact the patient next week. Discussed with the lipid clinic, I suggested Lipitor 20 mg one by mouth daily, they agreed. Advise patient to call the lipid clinic to be seen in 2 months for followup

## 2013-05-05 NOTE — Patient Instructions (Addendum)
Start taking an aspirin 81 mg daily I will talk with a cholesterol clinic andlet you know next week about the next step. Next visit in 1 year

## 2013-05-05 NOTE — Progress Notes (Signed)
  Subjective:    Patient ID: Robert Soto, male    DOB: 23-Jul-1966, 47 y.o.   MRN: [id number]  HPI CPX  Past Medical History  Diagnosis Date  . High triglycerides     hx. up to 900s- was treated with vytorin (d/c when he was training for marathon & TG decreased)   Past Surgical History  Procedure Laterality Date  . Tonsillectomy  1976  . Vasectomy  2012   History   Social History  . Marital Status: Married    Spouse Name: N/A    Number of Children: 2  . Years of Education: N/A   Occupational History  . managment    Social History Main Topics  . Smoking status: Former Games developer  . Smokeless tobacco: Never Used  . Alcohol Use: Yes     Comment: socially   . Drug Use: No  . Sexually Active: Not on file   Other Topics Concern  . Not on file   Social History Narrative  . No narrative on file   Family History  Problem Relation Age of Onset  . Coronary artery disease Mother     due to septic shock  . Coronary artery disease Other     a number of uncles   . Heart attack Father 87  . Stroke Other     aunt  . Diabetes Neg Hx   . Colon cancer Neg Hx   . Prostate cancer Neg Hx     Review of Systems Diet, regular Exercise, active at home, occasionally exercises but not as much as before. No chest pain or shortness or breath No nausea, vomiting, diarrhea No dysuria gross hematuria. Occasional nosebleeds, not severe sometimes up to 3 times a week, this is going on for a while.     Objective:   Physical Exam BP 115/80  Pulse 56  Temp(Src) 97.8 F (36.6 C) (Oral)  Wt 213 lb 12.8 oz (96.979 kg)  BMI 31.56 kg/m2  SpO2 98% General -- alert, well-developed, NAD Neck --no thyromegaly HEENT-- nostril normal , TM slt bulge but no red Lungs -- normal respiratory effort, no intercostal retractions, no accessory muscle use, and normal breath sounds.   Heart-- normal rate, regular rhythm, no murmur, and no gallop.   Abdomen--soft, non-tender, no distention, no masses,  no HSM, no guarding, and no rigidity.   Extremities-- no pretibial edema bilaterally  Neurologic-- alert & oriented X3 and strength normal in all extremities. Psych-- Cognition and judgment appear intact. Alert and cooperative with normal attention span and concentration.  not anxious appearing and not depressed appearing.

## 2013-05-05 NOTE — Assessment & Plan Note (Addendum)
Td 2011 Diet exercise discussed, all labs reviewed.  Start ASA d/t strong FH CAD

## 2013-05-08 ENCOUNTER — Encounter: Payer: Self-pay | Admitting: Internal Medicine

## 2013-05-08 MED ORDER — ATORVASTATIN CALCIUM 20 MG PO TABS: 20.0000 mg | ORAL_TABLET | Freq: Every day | ORAL | Status: DC

## 2013-07-05 ENCOUNTER — Encounter: Payer: PRIVATE HEALTH INSURANCE | Admitting: Internal Medicine

## 2013-07-14 ENCOUNTER — Telehealth: Payer: Self-pay

## 2013-07-14 NOTE — Telephone Encounter (Signed)
Patient's contact information is not up to date.

## 2013-07-17 ENCOUNTER — Encounter: Payer: PRIVATE HEALTH INSURANCE | Admitting: Internal Medicine

## 2013-08-18 ENCOUNTER — Encounter: Payer: PRIVATE HEALTH INSURANCE | Admitting: Internal Medicine

## 2013-09-25 ENCOUNTER — Other Ambulatory Visit: Payer: Self-pay | Admitting: Internal Medicine

## 2013-09-26 ENCOUNTER — Other Ambulatory Visit: Payer: Self-pay | Admitting: Internal Medicine

## 2013-09-26 NOTE — Telephone Encounter (Signed)
rx refilled per protocol. DJR  

## 2013-11-29 ENCOUNTER — Other Ambulatory Visit: Payer: Self-pay | Admitting: Internal Medicine

## 2013-11-29 DIAGNOSIS — E781 Pure hyperglyceridemia: Secondary | ICD-10-CM

## 2013-12-05 ENCOUNTER — Other Ambulatory Visit: Payer: Self-pay | Admitting: *Deleted

## 2013-12-05 MED ORDER — NIACIN ER (ANTIHYPERLIPIDEMIC) 1000 MG PO TBCR: EXTENDED_RELEASE_TABLET | ORAL | Status: DC

## 2013-12-05 MED ORDER — ATORVASTATIN CALCIUM 20 MG PO TABS: ORAL_TABLET | ORAL | Status: DC

## 2013-12-05 MED ORDER — FENOFIBRATE 150 MG PO CAPS: ORAL_CAPSULE | ORAL | Status: DC

## 2014-04-05 ENCOUNTER — Other Ambulatory Visit: Payer: Self-pay | Admitting: *Deleted

## 2014-04-05 ENCOUNTER — Telehealth: Payer: Self-pay | Admitting: *Deleted

## 2014-04-05 DIAGNOSIS — Z0000 Encounter for general adult medical examination without abnormal findings: Secondary | ICD-10-CM

## 2014-04-05 NOTE — Telephone Encounter (Signed)
Labs ordered - pt states will go to [facility] to have labs done.  Can you overbook / schedule physical appointment @ 1600 on 04/17/14 per Dr.PAZ  tspanks .

## 2014-04-05 NOTE — Telephone Encounter (Signed)
Message copied by Peggyann Shoals on Thu Apr 05, 2014  3:27 PM ------      Message from: [name] E      Created: Wed Apr 04, 2014  1:18 PM      Regarding: please make appointments       Please call pt:      Needs labs 04-16-14      FLP CBC CMP TSH--- dx V70            Needs CPX late 7-140-15      thanks ------

## 2014-04-16 ENCOUNTER — Other Ambulatory Visit (INDEPENDENT_AMBULATORY_CARE_PROVIDER_SITE_OTHER): Payer: Managed Care, Other (non HMO)

## 2014-04-16 DIAGNOSIS — Z0000 Encounter for general adult medical examination without abnormal findings: Secondary | ICD-10-CM

## 2014-04-16 LAB — COMPREHENSIVE METABOLIC PANEL
ALK PHOS: 63 U/L (ref 39–117)
ALT: 32 U/L (ref 0–53)
AST: 31 U/L (ref 0–37)
Albumin: 4.2 g/dL (ref 3.5–5.2)
BUN: 20 mg/dL (ref 6–23)
CHLORIDE: 108 meq/L (ref 96–112)
CO2: 25 mEq/L (ref 19–32)
CREATININE: 0.9 mg/dL (ref 0.4–1.5)
Calcium: 9.2 mg/dL (ref 8.4–10.5)
GFR: 94.3 mL/min (ref >60.00)
Glucose, Bld: 89 mg/dL (ref 70–99)
Potassium: 3.8 mEq/L (ref 3.5–5.1)
Sodium: 140 mEq/L (ref 135–145)
Total Bilirubin: 0.6 mg/dL (ref 0.2–1.2)
Total Protein: 7.3 g/dL (ref 6.0–8.3)

## 2014-04-16 LAB — CBC
HCT: 43.5 % (ref 39.0–52.0)
Hemoglobin: 14.5 g/dL (ref 13.0–17.0)
MCHC: 33.4 g/dL (ref 30.0–36.0)
MCV: 88.9 fl (ref 78.0–100.0)
Platelets: 213 10*3/uL (ref 150.0–400.0)
RBC: 4.89 Mil/uL (ref 4.22–5.81)
RDW: 13.2 % (ref 11.5–15.5)
WBC: 6.2 10*3/uL (ref 4.0–10.5)

## 2014-04-16 LAB — LIPID PANEL
Cholesterol: 234 mg/dL — ABNORMAL HIGH (ref 0–200)
HDL: 42.7 mg/dL (ref >39.00)
NONHDL: 191.3
Total CHOL/HDL Ratio: 5
Triglycerides: 797 mg/dL — ABNORMAL HIGH (ref 0.0–149.0)
VLDL: 159.4 mg/dL — ABNORMAL HIGH (ref 0.0–40.0)

## 2014-04-16 LAB — TSH: TSH: 2.27 u[IU]/mL (ref 0.35–4.50)

## 2014-04-17 ENCOUNTER — Ambulatory Visit (INDEPENDENT_AMBULATORY_CARE_PROVIDER_SITE_OTHER): Payer: Managed Care, Other (non HMO) | Admitting: Internal Medicine

## 2014-04-17 ENCOUNTER — Encounter: Payer: Self-pay | Admitting: Internal Medicine

## 2014-04-17 VITALS — BP 96/60 | HR 61 | Temp 98.2°F | Ht 69.1 in | Wt 207.0 lb

## 2014-04-17 DIAGNOSIS — Z0000 Encounter for general adult medical examination without abnormal findings: Secondary | ICD-10-CM

## 2014-04-17 DIAGNOSIS — E781 Pure hyperglyceridemia: Secondary | ICD-10-CM

## 2014-04-17 NOTE — Assessment & Plan Note (Addendum)
Tg continue elevated Currently on lipitor, lipofen, fish oil and niacin Few days before last FLP he ran out of lipofen and diet was not the best Plan: Cont meds, exercise, work on diet, labs in 3 months

## 2014-04-17 NOTE — Progress Notes (Signed)
Subjective:    Patient ID: Robert Soto, male    DOB: 1966-08-06, 48 y.o.   MRN: [id number]  DOS:  04/17/2014 Type of visit - description: CPX    ROS No  CP, SOB No palpitations  No orthopnea , DOE Denies  nausea, vomiting diarrhea, blood in the stools (-) cough, sputum production (-) wheezing, chest congestion  No dysuria, gross hematuria, difficulty urinating  No anxiety, depression    Past Medical History  Diagnosis Date  . High triglycerides     hx. up to 900s- was treated with vytorin (d/c when he was training for marathon & TG decreased)    Past Surgical History  Procedure Laterality Date  . Tonsillectomy  1976  . Vasectomy  2012    History   Social History  . Marital Status: Married    Spouse Name: N/A    Number of Children: 2  . Years of Education: N/A   Occupational History  . managment    Social History Main Topics  . Smoking status: Former Research scientist (life sciences)  . Smokeless tobacco: Never Used  . Alcohol Use: Yes     Comment: socially   . Drug Use: No  . Sexual Activity: Not on file   Other Topics Concern  . Not on file   Social History Narrative  . No narrative on file     Family History  Problem Relation Age of Onset  . Coronary artery disease Mother     due to septic shock  . Coronary artery disease Other     a number of uncles   . Heart attack Father 88  . Stroke Other     aunt  . Diabetes Neg Hx   . Colon cancer Neg Hx   . Prostate cancer Neg Hx       Medication List       This list is accurate as of: 04/17/14 11:59 PM.  Always use your most recent med list.               aspirin 81 MG tablet  Take 81 mg by mouth daily.     atorvastatin 20 MG tablet  Commonly known as:  LIPITOR  TAKE ONE TABLET BY MOUTH DAILY     Fenofibrate 150 MG Caps  Commonly known as:  LIPOFEN  TAKE ONE CAPSULE BY MOUTH EVERY DAY     fish oil-omega-3 fatty acids 1000 MG capsule  Take 2 g by mouth daily.     niacin 1000 MG CR tablet  Commonly known  as:  NIASPAN  TAKE ONE TABLET BY MOUTH EVERY NIGHT AT BEDTIME           Objective:   Physical Exam BP 96/60  Pulse 61  Temp(Src) 98.2 F (36.8 C)  Ht 5' 9.1" (1.755 m)  Wt 207 lb (93.895 kg)  BMI 30.49 kg/m2  SpO2 95% General -- alert, well-developed, NAD.  Neck --no thyromegaly , normal carotid pulse  HEENT-- Not pale.  Lungs -- normal respiratory effort, no intercostal retractions, no accessory muscle use, and normal breath sounds.  Heart-- normal rate, regular rhythm, no murmur.  Abdomen-- Not distended, good bowel sounds,soft, non-tender.  Extremities-- no pretibial edema bilaterally  Neurologic--  alert & oriented X3. Speech normal, gait appropriate for age, strength symmetric and appropriate for age.  Psych-- Cognition and judgment appear intact. Cooperative with normal attention span and concentration. No anxious or depressed appearing.       Assessment & Plan:

## 2014-04-17 NOTE — Assessment & Plan Note (Addendum)
Td 2011 Diet exercise discussed, all labs reviewed.  + FH CAD, on ASA , Asymptomatic, getting ready for a marathon, able to run 12 miles or more without symptoms.

## 2014-04-17 NOTE — Patient Instructions (Signed)
Come back fasting in 3 months for labs only: FLP AST ALT     Next visit is for a physical exam in 1 year

## 2014-04-17 NOTE — Progress Notes (Signed)
Pre visit review using our clinic review tool, if applicable. No additional management support is needed unless otherwise documented below in the visit note. 

## 2014-04-18 MED ORDER — ATORVASTATIN CALCIUM 20 MG PO TABS: ORAL_TABLET | ORAL | Status: DC

## 2014-04-18 MED ORDER — FENOFIBRATE 150 MG PO CAPS: ORAL_CAPSULE | ORAL | Status: DC

## 2014-04-18 MED ORDER — NIACIN ER (ANTIHYPERLIPIDEMIC) 1000 MG PO TBCR: EXTENDED_RELEASE_TABLET | ORAL | Status: DC

## 2014-07-11 ENCOUNTER — Other Ambulatory Visit: Payer: Self-pay | Admitting: Internal Medicine

## 2014-07-11 DIAGNOSIS — E781 Pure hyperglyceridemia: Secondary | ICD-10-CM

## 2014-07-12 ENCOUNTER — Other Ambulatory Visit (INDEPENDENT_AMBULATORY_CARE_PROVIDER_SITE_OTHER): Payer: Managed Care, Other (non HMO)

## 2014-07-12 DIAGNOSIS — E781 Pure hyperglyceridemia: Secondary | ICD-10-CM

## 2014-07-12 LAB — ALT: ALT: 27 U/L (ref 0–53)

## 2014-07-12 LAB — LIPID PANEL
CHOLESTEROL: 169 mg/dL (ref 0–200)
HDL: 43.8 mg/dL (ref >39.00)
LDL CALC: 102 mg/dL — AB (ref 0–99)
NonHDL: 125.2
TRIGLYCERIDES: 118 mg/dL (ref 0.0–149.0)
Total CHOL/HDL Ratio: 4
VLDL: 23.6 mg/dL (ref 0.0–40.0)

## 2014-07-12 LAB — AST: AST: 28 U/L (ref 0–37)

## 2014-10-28 ENCOUNTER — Other Ambulatory Visit: Payer: Self-pay | Admitting: Internal Medicine

## 2014-10-28 MED ORDER — FENOFIBRATE 150 MG PO CAPS: ORAL_CAPSULE | ORAL | Status: DC

## 2014-10-28 MED ORDER — NIACIN ER (ANTIHYPERLIPIDEMIC) 1000 MG PO TBCR: EXTENDED_RELEASE_TABLET | ORAL | Status: DC

## 2014-10-28 MED ORDER — ATORVASTATIN CALCIUM 20 MG PO TABS: ORAL_TABLET | ORAL | Status: DC

## 2015-05-13 ENCOUNTER — Telehealth: Payer: Self-pay | Admitting: Internal Medicine

## 2015-05-13 NOTE — Telephone Encounter (Signed)
Dr. Larose Kells does not order labs prior to appts. Pt can either come to appt fasting (if late afternoon, Pt can eat a sensible breakfast and skip lunch) or he can come back another day for labs.

## 2015-05-13 NOTE — Telephone Encounter (Signed)
Caller name: Suvan Stcyr  Relationship to patient: Self  Can be reached:[phone] Pharmacy:  Reason for call: pt would like to have labs completed before his CPE so that Dr Larose Kells will have his results in at the time of his visit. He says that this is his normal process and wants to know if he can come in before as usual?

## 2015-05-16 NOTE — Telephone Encounter (Signed)
05/16/15 called pt. LVM on machine advising. SJ.

## 2015-05-23 ENCOUNTER — Telehealth: Payer: Self-pay | Admitting: Internal Medicine

## 2015-05-23 NOTE — Telephone Encounter (Signed)
pre visit letter mailed 05/14/15

## 2015-06-03 ENCOUNTER — Encounter: Payer: Self-pay | Admitting: *Deleted

## 2015-06-03 ENCOUNTER — Telehealth: Payer: Self-pay

## 2015-06-03 ENCOUNTER — Telehealth: Payer: Self-pay | Admitting: *Deleted

## 2015-06-03 DIAGNOSIS — Z0000 Encounter for general adult medical examination without abnormal findings: Secondary | ICD-10-CM

## 2015-06-03 NOTE — Telephone Encounter (Signed)
Pre-Visit Call completed with patient and chart updated.   Pre-Visit Info documented in Specialty Comments under [employer].    

## 2015-06-03 NOTE — Telephone Encounter (Signed)
-----   Message from Colon Branch, MD sent at 06/03/2015  2:32 PM EDT ----- Regarding: Orders Patient will go to [facility] tomorrow at around 7.30 for blood work only: CMP, FLP, CBC, TSH DX CPX

## 2015-06-03 NOTE — Telephone Encounter (Signed)
Labs ordered to be completed at [facility]. HIV added on to orders.

## 2015-06-04 ENCOUNTER — Ambulatory Visit (INDEPENDENT_AMBULATORY_CARE_PROVIDER_SITE_OTHER): Payer: Managed Care, Other (non HMO) | Admitting: Internal Medicine

## 2015-06-04 ENCOUNTER — Encounter: Payer: Self-pay | Admitting: Internal Medicine

## 2015-06-04 ENCOUNTER — Other Ambulatory Visit (INDEPENDENT_AMBULATORY_CARE_PROVIDER_SITE_OTHER): Payer: Managed Care, Other (non HMO)

## 2015-06-04 VITALS — BP 106/58 | HR 71 | Temp 97.9°F | Ht 69.0 in | Wt 218.0 lb

## 2015-06-04 DIAGNOSIS — R7989 Other specified abnormal findings of blood chemistry: Secondary | ICD-10-CM

## 2015-06-04 DIAGNOSIS — Z0000 Encounter for general adult medical examination without abnormal findings: Secondary | ICD-10-CM

## 2015-06-04 DIAGNOSIS — E781 Pure hyperglyceridemia: Secondary | ICD-10-CM

## 2015-06-04 LAB — CBC WITH DIFFERENTIAL/PLATELET
Basophils Absolute: 0 10*3/uL (ref 0.0–0.1)
Basophils Relative: 0.4 % (ref 0.0–3.0)
EOS PCT: 2.9 % (ref 0.0–5.0)
Eosinophils Absolute: 0.3 10*3/uL (ref 0.0–0.7)
HCT: 44.3 % (ref 39.0–52.0)
Hemoglobin: 15.2 g/dL (ref 13.0–17.0)
LYMPHS ABS: 2 10*3/uL (ref 0.7–4.0)
Lymphocytes Relative: 19.8 % (ref 12.0–46.0)
MCHC: 34.3 g/dL (ref 30.0–36.0)
MCV: 88.7 fl (ref 78.0–100.0)
MONO ABS: 1 10*3/uL (ref 0.1–1.0)
Monocytes Relative: 10.2 % (ref 3.0–12.0)
NEUTROS ABS: 6.6 10*3/uL (ref 1.4–7.7)
NEUTROS PCT: 66.7 % (ref 43.0–77.0)
PLATELETS: 225 10*3/uL (ref 150.0–400.0)
RBC: 5 Mil/uL (ref 4.22–5.81)
RDW: 12.3 % (ref 11.5–15.5)
WBC: 9.9 10*3/uL (ref 4.0–10.5)

## 2015-06-04 LAB — COMPREHENSIVE METABOLIC PANEL
ALBUMIN: 4.5 g/dL (ref 3.5–5.2)
ALT: 23 U/L (ref 0–53)
AST: 24 U/L (ref 0–37)
Alkaline Phosphatase: 56 U/L (ref 39–117)
BILIRUBIN TOTAL: 0.4 mg/dL (ref 0.2–1.2)
BUN: 21 mg/dL (ref 6–23)
CALCIUM: 10 mg/dL (ref 8.4–10.5)
CHLORIDE: 104 meq/L (ref 96–112)
CO2: 26 meq/L (ref 19–32)
Creatinine, Ser: 1.14 mg/dL (ref 0.40–1.50)
GFR: 73.26 mL/min (ref >60.00)
Glucose, Bld: 102 mg/dL — ABNORMAL HIGH (ref 70–99)
Potassium: 4.2 mEq/L (ref 3.5–5.1)
Sodium: 139 mEq/L (ref 135–145)
Total Protein: 7.6 g/dL (ref 6.0–8.3)

## 2015-06-04 LAB — LIPID PANEL
CHOL/HDL RATIO: 5
CHOLESTEROL: 200 mg/dL (ref 0–200)
HDL: 43.3 mg/dL (ref >39.00)
NONHDL: 156.98
TRIGLYCERIDES: 266 mg/dL — AB (ref 0.0–149.0)
VLDL: 53.2 mg/dL — ABNORMAL HIGH (ref 0.0–40.0)

## 2015-06-04 LAB — TSH: TSH: 1.77 u[IU]/mL (ref 0.35–4.50)

## 2015-06-04 LAB — LDL CHOLESTEROL, DIRECT: Direct LDL: 110 mg/dL

## 2015-06-04 NOTE — Assessment & Plan Note (Signed)
Labs from today reviewed with the patient, his taking Lipitor, niacin, fenofibrate, fish oil,  labs satisfactory.

## 2015-06-04 NOTE — Progress Notes (Signed)
Pre visit review using our clinic review tool, if applicable. No additional management support is needed unless otherwise documented below in the visit note. 

## 2015-06-04 NOTE — Patient Instructions (Signed)
Next visit  for a  Physical in 1 year  Please schedule an appointment at the front desk

## 2015-06-04 NOTE — Assessment & Plan Note (Addendum)
Td 2011 Declined a flu shot  CCS-- not indicated  Prostate ca screening-- not indicated  Diet : Doing okay recently exercise : Had a leg injury, was unable to exercise but is ready to go back. + FH CAD, on ASA , Asymptomatic

## 2015-06-04 NOTE — Progress Notes (Signed)
Subjective:    Patient ID: Robert Soto, male    DOB: 1966-07-30, 49 y.o.   MRN: [id number]  DOS:  06/04/2015 Type of visit - description : CPX Interval history: Feeling well, no concerns, excellent medication compliance    Review of Systems  Constitutional: No fever. No chills. No unexplained wt changes. No unusual sweats  HEENT: No dental problems, no ear discharge, no facial swelling, no voice changes. No eye discharge, no eye  redness , no  intolerance to light   Respiratory: No wheezing , no  difficulty breathing. No cough , no mucus production  Cardiovascular: No CP, no leg swelling , no  Palpitations  GI: no nausea, no vomiting, no diarrhea , no  abdominal pain.  No blood in the stools. No dysphagia, no odynophagia    Endocrine: No polyphagia, no polyuria , no polydipsia  GU: No dysuria, gross hematuria, difficulty urinating. No urinary urgency, no frequency.  Musculoskeletal: No joint swellings or unusual aches or pains  Skin: No change in the color of the skin, palor , no  Rash  Allergic, immunologic: No environmental allergies , no  food allergies  Neurological: No dizziness no  syncope. No headaches. No diplopia, no slurred, no slurred speech, no motor deficits, no facial  Numbness  Hematological: No enlarged lymph nodes, no easy bruising , no unusual bleedings  Psychiatry: No suicidal ideas, no hallucinations, no beavior problems, no confusion.  No unusual/severe anxiety, no depression    Past Medical History  Diagnosis Date  . High triglycerides     hx. up to 900s- was treated with vytorin (d/c when he was training for marathon & TG decreased)    Past Surgical History  Procedure Laterality Date  . Tonsillectomy  1976  . Vasectomy  2012    Social History   Social History  . Marital Status: Married    Spouse Name: N/A  . Number of Children: 2  . Years of Education: N/A   Occupational History  . managment    Social History Main Topics  .  Smoking status: Former Research scientist (life sciences)  . Smokeless tobacco: Never Used  . Alcohol Use: Yes     Comment: socially   . Drug Use: No  . Sexual Activity: Not on file   Other Topics Concern  . Not on file   Social History Narrative   Original from [place]     Family History  Problem Relation Age of Onset  . Coronary artery disease Mother     due to septic shock  . Coronary artery disease Other     a number of uncles   . Heart attack Father 52  . Stroke Other     aunt  . Diabetes Neg Hx   . Colon cancer Neg Hx   . Prostate cancer Neg Hx        Medication List       This list is accurate as of: 06/04/15 11:59 PM.  Always use your most recent med list.               aspirin 81 MG tablet  Take 81 mg by mouth daily.     atorvastatin 20 MG tablet  Commonly known as:  LIPITOR  TAKE ONE TABLET BY MOUTH DAILY     Fenofibrate 150 MG Caps  Commonly known as:  LIPOFEN  TAKE ONE CAPSULE BY MOUTH EVERY DAY     fish oil-omega-3 fatty acids 1000 MG capsule  Take 2 g by  mouth daily.     niacin 1000 MG CR tablet  Commonly known as:  NIASPAN  TAKE ONE TABLET BY MOUTH EVERY NIGHT AT BEDTIME           Objective:   Physical Exam BP 106/58 mmHg  Pulse 71  Temp(Src) 97.9 F (36.6 C) (Oral)  Ht 5\' 9"  (1.753 m)  Wt 218 lb (98.884 kg)  BMI 32.18 kg/m2  SpO2 97% General:   Well developed, well nourished . NAD.  HEENT:  Normocephalic . Face symmetric, atraumatic Neck: No thyromegaly  Lungs:  CTA B Normal respiratory effort, no intercostal retractions, no accessory muscle use. Heart: RRR,  no murmur.  no pretibial edema bilaterally  Abdomen:  Not distended, soft, non-tender. No rebound or rigidity. No mass,organomegaly Skin: Not pale. Not jaundice Neurologic:  alert & oriented X3.  Speech normal, gait appropriate for age and unassisted Psych--  Cognition and judgment appear intact.  Cooperative with normal attention span and concentration.  Behavior appropriate. No  anxious or depressed appearing.    Assessment & Plan:

## 2015-06-05 LAB — HIV ANTIBODY (ROUTINE TESTING W REFLEX): HIV 1&2 Ab, 4th Generation: NONREACTIVE

## 2015-06-07 ENCOUNTER — Other Ambulatory Visit: Payer: Self-pay | Admitting: Internal Medicine

## 2015-08-09 ENCOUNTER — Telehealth: Payer: Self-pay | Admitting: Internal Medicine

## 2015-08-09 MED ORDER — ALPRAZOLAM 0.5 MG PO TBDP: 0.5000 mg | ORAL_TABLET | Freq: Two times a day (BID) | ORAL | Status: DC | PRN

## 2015-08-09 NOTE — Telephone Encounter (Signed)
Rx faxed to [facility] pharmacy.  

## 2015-08-09 NOTE — Telephone Encounter (Signed)
Under a lot of stress at work, unable to sleep. Patient is counseled over the phone, we talk about SSRIs versus prn medication, he feels prn would be perfect. Plan: Xanax: Half, 1 or 1.5 tablets as needed. Warned about excessive sleepiness, do not drive if drowsy.

## 2016-01-14 ENCOUNTER — Other Ambulatory Visit: Payer: Self-pay | Admitting: Internal Medicine

## 2016-01-14 MED ORDER — ALPRAZOLAM 0.5 MG PO TABS: 0.5000 mg | ORAL_TABLET | Freq: Two times a day (BID) | ORAL | Status: DC | PRN

## 2016-01-14 NOTE — Progress Notes (Signed)
Request a refill on alprazolam

## 2016-04-09 ENCOUNTER — Telehealth: Payer: Self-pay | Admitting: Internal Medicine

## 2016-04-09 MED ORDER — ATORVASTATIN CALCIUM 20 MG PO TABS: 20.0000 mg | ORAL_TABLET | Freq: Every day | ORAL | Status: DC

## 2016-04-09 MED ORDER — FENOFIBRATE 150 MG PO CAPS: 150.0000 mg | ORAL_CAPSULE | Freq: Every day | ORAL | Status: DC

## 2016-04-09 MED ORDER — NIACIN ER (ANTIHYPERLIPIDEMIC) 1000 MG PO TBCR: 1000.0000 mg | EXTENDED_RELEASE_TABLET | Freq: Every day | ORAL | Status: DC

## 2016-04-09 NOTE — Telephone Encounter (Signed)
Pt says that he is no longer with [employer]. Pt is requesting a refill on 3 medications.   1. Atorvastatin  2. Fenofibrate 3. Niacin   Pharmacy: [id number] - [place], [place] - [address] AT [place]

## 2016-04-09 NOTE — Telephone Encounter (Signed)
Rx's sent. Please call Pt and have him schedule CPE at his convenience. He is due after 06/03/2016. Thank you.

## 2016-04-13 ENCOUNTER — Telehealth: Payer: Self-pay | Admitting: *Deleted

## 2016-04-13 NOTE — Telephone Encounter (Signed)
PA initiated on [email or web address]. KeyShelby Dubin - PA Case [id number]  Awaiting determination.

## 2016-04-13 NOTE — Telephone Encounter (Signed)
PA Case [id number] is Approved. For further questions, call [phone].

## 2016-04-13 NOTE — Telephone Encounter (Signed)
Insurance info used [facility] ID#: [id number]

## 2016-04-22 NOTE — Telephone Encounter (Signed)
Pt has been scheduled.  °

## 2016-07-07 ENCOUNTER — Encounter: Payer: Managed Care, Other (non HMO) | Admitting: Internal Medicine

## 2016-09-07 ENCOUNTER — Telehealth: Payer: Self-pay | Admitting: Internal Medicine

## 2016-09-07 MED ORDER — AZITHROMYCIN 250 MG PO TABS: ORAL_TABLET | ORAL | 0 refills | Status: DC

## 2016-09-07 NOTE — Telephone Encounter (Signed)
I spoke with the patient, having a URI for the last 7 days, cough, chest congestion, no sinus pain or congestion. No fever. He is leaving town today, no appointments available. Recommend rest, fluids, Mucinex DM. Will call a Z-Pak. He needs to go to urgent care if he is not improving in the next few days.

## 2016-09-08 ENCOUNTER — Encounter: Payer: Managed Care, Other (non HMO) | Admitting: Internal Medicine

## 2016-09-23 ENCOUNTER — Other Ambulatory Visit: Payer: Self-pay

## 2016-09-23 MED ORDER — NIACIN ER (ANTIHYPERLIPIDEMIC) 1000 MG PO TBCR: 1000.0000 mg | EXTENDED_RELEASE_TABLET | Freq: Every day | ORAL | 1 refills | Status: DC

## 2016-09-23 MED ORDER — ATORVASTATIN CALCIUM 20 MG PO TABS: 20.0000 mg | ORAL_TABLET | Freq: Every day | ORAL | 1 refills | Status: DC

## 2016-09-23 MED ORDER — FENOFIBRATE 150 MG PO CAPS: 150.0000 mg | ORAL_CAPSULE | Freq: Every day | ORAL | 1 refills | Status: DC

## 2016-10-20 ENCOUNTER — Encounter: Payer: Self-pay | Admitting: Internal Medicine

## 2016-10-20 ENCOUNTER — Ambulatory Visit (INDEPENDENT_AMBULATORY_CARE_PROVIDER_SITE_OTHER): Payer: 59 | Admitting: Internal Medicine

## 2016-10-20 VITALS — BP 108/72 | HR 76 | Temp 98.6°F | Resp 14 | Ht 69.0 in | Wt 221.1 lb

## 2016-10-20 DIAGNOSIS — Z23 Encounter for immunization: Secondary | ICD-10-CM | POA: Diagnosis not present

## 2016-10-20 DIAGNOSIS — Z0000 Encounter for general adult medical examination without abnormal findings: Secondary | ICD-10-CM

## 2016-10-20 NOTE — Patient Instructions (Signed)
  Please go fasting to our [place] location for fasting blood work: CMP, FLP, CBC, A1c  Next visit in one year, physical exam

## 2016-10-20 NOTE — Progress Notes (Signed)
Subjective:    Patient ID: Robert Soto, male    DOB: 12-01-1965, 51 y.o.   MRN: [id number]  DOS:  10/20/2016 Type of visit - description : CPX Interval history:  No concerns, feeling well.  Review of Systems  Constitutional: No fever. No chills. No unexplained wt changes. No unusual sweats  HEENT: No dental problems, no ear discharge, no facial swelling, no voice changes. No eye discharge, no eye  redness , no  intolerance to light   Respiratory: No wheezing , no  difficulty breathing. No cough , no mucus production  Cardiovascular: No CP, no leg swelling , no  Palpitations  GI: no nausea, no vomiting, no diarrhea , no  abdominal pain.  No blood in the stools. No dysphagia, no odynophagia    Endocrine: No polyphagia, no polyuria , no polydipsia  GU: No dysuria, gross hematuria, difficulty urinating. No urinary urgency, no frequency.  Musculoskeletal: No joint swellings or unusual aches or pains  Skin: No change in the color of the skin, palor , no  Rash  Allergic, immunologic: No environmental allergies , no  food allergies  Neurological: No dizziness no  syncope. No headaches. No diplopia, no slurred, no slurred speech, no motor deficits, no facial  Numbness  Hematological: No enlarged lymph nodes, no easy bruising , no unusual bleedings  Psychiatry: No suicidal ideas, no hallucinations, no beavior problems, no confusion.  No unusual/severe anxiety, no depression  Past Medical History:  Diagnosis Date  . High triglycerides    hx. up to 900s- was treated with vytorin (d/c when he was training for marathon & TG decreased)    Past Surgical History:  Procedure Laterality Date  . TONSILLECTOMY  1976  . VASECTOMY  2012    Social History   Social History  . Marital status: Married    Spouse name: N/A  . Number of children: 2  . 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 Smoking status: Former  Research scientist (life sciences)  . Smokeless tobacco: Never Used  . Alcohol use Yes     Comment: socially   . Drug use: No  . Sexual activity: Not on file   Other Topics Concern  . Not on file   Social History Narrative   Original from [place]     Family History  Problem Relation Age of Onset  . Coronary artery disease Mother     due to septic shock  . Heart attack Father 27  . Stroke Other     aunt  . Coronary artery disease Other     a number of uncles   . Diabetes Neg Hx   . Colon cancer Neg Hx   . Prostate cancer Neg Hx      Allergies as of 10/20/2016   No Known Allergies     Medication List       Accurate as of 10/20/16 11:59 PM. Always use your most recent med list.          ALPRAZolam 0.5 MG tablet Commonly known as:  XANAX Take 1 tablet (0.5 mg total) by mouth 2 (two) times daily as needed for anxiety.   aspirin 81 MG tablet Take 81 mg by mouth daily.   atorvastatin 20 MG tablet Commonly known as:  LIPITOR Take 1 tablet (20 mg total) by mouth daily.   Fenofibrate 150 MG Caps Take 1 capsule (150 mg total) by mouth daily.  fish oil-omega-3 fatty acids 1000 MG capsule Take 2 g by mouth daily.   niacin 1000 MG CR tablet Commonly known as:  NIASPAN Take 1 tablet (1,000 mg total) by mouth at bedtime.          Objective:   Physical Exam BP 108/72 (BP Location: Left Arm, Patient Position: Sitting, Cuff Size: Normal)   Pulse 76   Temp 98.6 F (37 C) (Oral)   Resp 14   Ht 5\' 9"  (1.753 m)   Wt 221 lb 2 oz (100.3 kg)   SpO2 98%   BMI 32.65 kg/m   General:   Well developed, well nourished . NAD.  Neck: No  thyromegaly  HEENT:  Normocephalic . Face symmetric, atraumatic Lungs:  CTA B Normal respiratory effort, no intercostal retractions, no accessory muscle use. Heart: RRR,  no murmur.  No pretibial edema bilaterally  Abdomen:  Not distended, soft, non-tender. No rebound or rigidity.   Skin: Exposed areas without rash. Not pale. Not jaundice Neurologic:    alert & oriented X3.  Speech normal, gait appropriate for age and unassisted Strength symmetric and appropriate for age.  Psych: Cognition and judgment appear intact.  Cooperative with normal attention span and concentration.  Behavior appropriate. No anxious or depressed appearing.    Assessment & Plan:   Assessment Dyslipidemia: Triglycerides up to the 1156, usually responding very well to exercise more than diet + FH CAD, father age 24, 2011- (-) GTX    Plan: Dyslipidemia: Good med compliance, has improved his diet, exercising more and lost some weight only in the last 3 weeks. Check labs. Consider discontinue niacin RTC one year

## 2016-10-20 NOTE — Assessment & Plan Note (Addendum)
Td 2011.    flu shot today CCS-- not indicated  Prostate ca screening-- not indicated  Diet ,exercise : Improving over the last 3 weeks, walking regularly, has lost some weight + FH CAD, on ASA , [facility] Labs: CMP, CBC, FLP, A1c

## 2016-10-20 NOTE — Progress Notes (Signed)
Pre visit review using our clinic review tool, if applicable. No additional management support is needed unless otherwise documented below in the visit note. 

## 2016-10-22 DIAGNOSIS — Z09 Encounter for follow-up examination after completed treatment for conditions other than malignant neoplasm: Secondary | ICD-10-CM | POA: Insufficient documentation

## 2016-10-22 NOTE — Assessment & Plan Note (Signed)
Dyslipidemia: Good med compliance, has improved his diet, exercising more and lost some weight only in the last 3 weeks. Check labs. Consider discontinue niacin RTC one year

## 2016-12-10 ENCOUNTER — Other Ambulatory Visit (INDEPENDENT_AMBULATORY_CARE_PROVIDER_SITE_OTHER): Payer: 59

## 2016-12-10 DIAGNOSIS — R7989 Other specified abnormal findings of blood chemistry: Secondary | ICD-10-CM | POA: Diagnosis not present

## 2016-12-10 DIAGNOSIS — Z0000 Encounter for general adult medical examination without abnormal findings: Secondary | ICD-10-CM | POA: Diagnosis not present

## 2016-12-10 LAB — COMPREHENSIVE METABOLIC PANEL
ALT: 40 U/L (ref 0–53)
AST: 25 U/L (ref 0–37)
Albumin: 4.5 g/dL (ref 3.5–5.2)
Alkaline Phosphatase: 54 U/L (ref 39–117)
BUN: 21 mg/dL (ref 6–23)
CALCIUM: 9.7 mg/dL (ref 8.4–10.5)
CHLORIDE: 104 meq/L (ref 96–112)
CO2: 28 meq/L (ref 19–32)
Creatinine, Ser: 1.06 mg/dL (ref 0.40–1.50)
GFR: 79.16 mL/min (ref >60.00)
Glucose, Bld: 106 mg/dL — ABNORMAL HIGH (ref 70–99)
Potassium: 3.8 mEq/L (ref 3.5–5.1)
Sodium: 139 mEq/L (ref 135–145)
Total Bilirubin: 0.5 mg/dL (ref 0.2–1.2)
Total Protein: 7.4 g/dL (ref 6.0–8.3)

## 2016-12-10 LAB — CBC WITH DIFFERENTIAL/PLATELET
BASOS ABS: 0 10*3/uL (ref 0.0–0.1)
Basophils Relative: 0.5 % (ref 0.0–3.0)
EOS PCT: 3 % (ref 0.0–5.0)
Eosinophils Absolute: 0.2 10*3/uL (ref 0.0–0.7)
HCT: 44 % (ref 39.0–52.0)
Hemoglobin: 14.9 g/dL (ref 13.0–17.0)
LYMPHS ABS: 2.3 10*3/uL (ref 0.7–4.0)
Lymphocytes Relative: 29.8 % (ref 12.0–46.0)
MCHC: 33.9 g/dL (ref 30.0–36.0)
MCV: 89.1 fl (ref 78.0–100.0)
MONO ABS: 0.8 10*3/uL (ref 0.1–1.0)
Monocytes Relative: 10.8 % (ref 3.0–12.0)
NEUTROS PCT: 55.9 % (ref 43.0–77.0)
Neutro Abs: 4.3 10*3/uL (ref 1.4–7.7)
Platelets: 249 10*3/uL (ref 150.0–400.0)
RBC: 4.94 Mil/uL (ref 4.22–5.81)
RDW: 12.9 % (ref 11.5–15.5)
WBC: 7.7 10*3/uL (ref 4.0–10.5)

## 2016-12-10 LAB — LDL CHOLESTEROL, DIRECT: Direct LDL: 116 mg/dL

## 2016-12-10 LAB — LIPID PANEL
Cholesterol: 234 mg/dL — ABNORMAL HIGH (ref 0–200)
HDL: 42.4 mg/dL (ref >39.00)
NONHDL: 191.33
TRIGLYCERIDES: 365 mg/dL — AB (ref 0.0–149.0)
Total CHOL/HDL Ratio: 6
VLDL: 73 mg/dL — ABNORMAL HIGH (ref 0.0–40.0)

## 2016-12-10 LAB — HEMOGLOBIN A1C: HEMOGLOBIN A1C: 5.9 % (ref 4.6–6.5)

## 2017-04-19 ENCOUNTER — Other Ambulatory Visit: Payer: Self-pay | Admitting: Internal Medicine

## 2017-05-10 ENCOUNTER — Other Ambulatory Visit: Payer: Self-pay

## 2017-05-10 ENCOUNTER — Other Ambulatory Visit: Payer: Self-pay | Admitting: Internal Medicine

## 2017-05-10 ENCOUNTER — Telehealth: Payer: Self-pay | Admitting: Internal Medicine

## 2017-05-10 MED ORDER — FENOFIBRATE 160 MG PO TABS: 160.0000 mg | ORAL_TABLET | Freq: Every day | ORAL | 2 refills | Status: DC

## 2017-05-10 NOTE — Telephone Encounter (Signed)
Spoke w/ [facility]- they did receive order placed on 04/20/2017 for fenofibrate 150mg - they spoke w/ Pt, and informed him that this was a non-formulary drug (strength) and he would need to pay cash price or MDs office could change to formulary alternative or fenofibrate 45mg  or 160mg .Informed I'd send Rx for 160mg  tablets.

## 2017-05-10 NOTE — Telephone Encounter (Signed)
thx

## 2017-05-10 NOTE — Telephone Encounter (Signed)
Patient reports he is having difficulties w/ RFs for  fenofibrate from [facility]. Please call them, let me know what we need to do.

## 2018-03-18 ENCOUNTER — Other Ambulatory Visit: Payer: Self-pay | Admitting: Internal Medicine

## 2018-03-18 MED ORDER — ALPRAZOLAM 0.5 MG PO TABS: 0.5000 mg | ORAL_TABLET | Freq: Two times a day (BID) | ORAL | 0 refills | Status: DC | PRN

## 2018-03-30 ENCOUNTER — Encounter: Payer: 59 | Admitting: Internal Medicine

## 2018-04-11 ENCOUNTER — Other Ambulatory Visit: Payer: Self-pay | Admitting: Internal Medicine

## 2018-04-20 ENCOUNTER — Telehealth: Payer: Self-pay

## 2018-04-20 DIAGNOSIS — Z0000 Encounter for general adult medical examination without abnormal findings: Secondary | ICD-10-CM

## 2018-04-20 DIAGNOSIS — R739 Hyperglycemia, unspecified: Secondary | ICD-10-CM

## 2018-04-20 NOTE — Telephone Encounter (Signed)
Labs ordered.

## 2018-04-20 NOTE — Telephone Encounter (Signed)
-----   Message from Colon Branch, MD sent at 04/20/2018  1:29 PM EDT ----- Regarding: labs at [facility] CMP, FLP, CBC, TSH- CPX  V6H (hyperglicemia)

## 2018-04-21 ENCOUNTER — Other Ambulatory Visit (INDEPENDENT_AMBULATORY_CARE_PROVIDER_SITE_OTHER): Payer: 59

## 2018-04-21 ENCOUNTER — Ambulatory Visit (INDEPENDENT_AMBULATORY_CARE_PROVIDER_SITE_OTHER): Payer: 59 | Admitting: Internal Medicine

## 2018-04-21 ENCOUNTER — Encounter: Payer: Self-pay | Admitting: Internal Medicine

## 2018-04-21 VITALS — BP 126/74 | HR 67 | Temp 98.6°F | Resp 16 | Ht 69.0 in | Wt 223.1 lb

## 2018-04-21 DIAGNOSIS — Z8249 Family history of ischemic heart disease and other diseases of the circulatory system: Secondary | ICD-10-CM

## 2018-04-21 DIAGNOSIS — R739 Hyperglycemia, unspecified: Secondary | ICD-10-CM | POA: Diagnosis not present

## 2018-04-21 DIAGNOSIS — Z0000 Encounter for general adult medical examination without abnormal findings: Secondary | ICD-10-CM

## 2018-04-21 LAB — CBC WITH DIFFERENTIAL/PLATELET
BASOS PCT: 0.5 % (ref 0.0–3.0)
Basophils Absolute: 0 10*3/uL (ref 0.0–0.1)
EOS ABS: 0.2 10*3/uL (ref 0.0–0.7)
Eosinophils Relative: 2.4 % (ref 0.0–5.0)
HCT: 44.9 % (ref 39.0–52.0)
Hemoglobin: 15.5 g/dL (ref 13.0–17.0)
Lymphocytes Relative: 31.9 % (ref 12.0–46.0)
Lymphs Abs: 2.3 10*3/uL (ref 0.7–4.0)
MCHC: 34.4 g/dL (ref 30.0–36.0)
MCV: 87.9 fl (ref 78.0–100.0)
MONO ABS: 0.8 10*3/uL (ref 0.1–1.0)
Monocytes Relative: 11.6 % (ref 3.0–12.0)
NEUTROS ABS: 3.8 10*3/uL (ref 1.4–7.7)
Neutrophils Relative %: 53.6 % (ref 43.0–77.0)
Platelets: 246 10*3/uL (ref 150.0–400.0)
RBC: 5.11 Mil/uL (ref 4.22–5.81)
RDW: 12.7 % (ref 11.5–15.5)
WBC: 7.1 10*3/uL (ref 4.0–10.5)

## 2018-04-21 LAB — COMPREHENSIVE METABOLIC PANEL
ALK PHOS: 58 U/L (ref 39–117)
ALT: 27 U/L (ref 0–53)
AST: 19 U/L (ref 0–37)
Albumin: 4.5 g/dL (ref 3.5–5.2)
BUN: 18 mg/dL (ref 6–23)
CO2: 26 meq/L (ref 19–32)
Calcium: 9.6 mg/dL (ref 8.4–10.5)
Chloride: 104 mEq/L (ref 96–112)
Creatinine, Ser: 1.1 mg/dL (ref 0.40–1.50)
GFR: 75.42 mL/min (ref >60.00)
GLUCOSE: 113 mg/dL — AB (ref 70–99)
POTASSIUM: 3.9 meq/L (ref 3.5–5.1)
SODIUM: 138 meq/L (ref 135–145)
TOTAL PROTEIN: 7.6 g/dL (ref 6.0–8.3)
Total Bilirubin: 0.5 mg/dL (ref 0.2–1.2)

## 2018-04-21 LAB — LIPID PANEL
CHOL/HDL RATIO: 5
Cholesterol: 230 mg/dL — ABNORMAL HIGH (ref 0–200)
HDL: 43.5 mg/dL (ref >39.00)
NONHDL: 186.38
TRIGLYCERIDES: 278 mg/dL — AB (ref 0.0–149.0)
VLDL: 55.6 mg/dL — ABNORMAL HIGH (ref 0.0–40.0)

## 2018-04-21 LAB — TSH: TSH: 2.29 u[IU]/mL (ref 0.35–4.50)

## 2018-04-21 LAB — LDL CHOLESTEROL, DIRECT: Direct LDL: 140 mg/dL

## 2018-04-21 LAB — HEMOGLOBIN A1C: Hgb A1c MFr Bld: 6.2 % (ref 4.6–6.5)

## 2018-04-21 MED ORDER — ATORVASTATIN CALCIUM 40 MG PO TABS: 40.0000 mg | ORAL_TABLET | Freq: Every day | ORAL | 3 refills | Status: DC

## 2018-04-21 NOTE — Progress Notes (Signed)
Pre visit review using our clinic review tool, if applicable. No additional management support is needed unless otherwise documented below in the visit note. 

## 2018-04-21 NOTE — Patient Instructions (Addendum)
   GO TO THE FRONT DESK Schedule your next appointment for a checkup in 6 months, fasting  Increase atorvastatin for 20 to 40 mg daily. Watch closely for unusual muscle aches  Okay to stop Niaspan when you run out  GENERAL INFORMATION ABOUT HEALTHY EATING, CHOLESTEROL  The American Heart Association, www.heart.org  Check the "Life's Simple 7" from the [facility] The American diabetes Association www.diabetes.org  "[facility] [place]" book

## 2018-04-21 NOTE — Assessment & Plan Note (Signed)
Td 2011.    CCS-- not indicated  Prostate ca screening-- not indicated  His lifestyle  fluctuates throughout the year, he has very good months with plenty of physical activity and healthy eating and others months he travels for work and is very difficult to exercise and eat healthy. + FH CAD, on ASA , Asx Labs completed today, reviewed with the patient.

## 2018-04-21 NOTE — Progress Notes (Signed)
Subjective:    Patient ID: Robert Soto, male    DOB: [birth year], 52 y.o.   MRN: [id number]  DOS:  04/21/2018 Type of visit - description : cpx Interval history: In general feeling well.  No major concerns   Review of Systems A 14 point review of systems is negative    Past Medical History:  Diagnosis Date  . High triglycerides    hx. up to 900s- was treated with vytorin (d/c when he was training for marathon & TG decreased)    Past Surgical History:  Procedure Laterality Date  . TONSILLECTOMY  1976  . VASECTOMY  2012    Social History   Socioeconomic History  . Marital status: Married    Spouse name: Not on file  . Number of children: 2  . Years of education: Not on file  . Highest education level: Not on file  Occupational History  . 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 Financial resource strain: Not on file  . Food insecurity:    Worry: Not on file    Inability: Not on file  . Transportation needs:    Medical: Not on file    Non-medical: Not on file  Tobacco Use  . Smoking status: Former Research scientist (life sciences)  . Smokeless tobacco: Never Used  Substance and Sexual Activity  . Alcohol use: Yes    Comment: socially   . Drug use: No  . Sexual activity: Not on file  Lifestyle  . Physical activity:    Days per week: Not on file    Minutes per session: Not on file  . Stress: Not on file  Relationships  . Social connections:    Talks on phone: Not on file    Gets together: Not on file    Attends religious service: Not on file    Active member of club or organization: Not on file    Attends meetings of clubs or organizations: Not on file    Relationship status: Not on file  . Intimate partner violence:    Fear of current or ex partner: Not on file    Emotionally abused: Not on file    Physically abused: Not on file    Forced sexual activity: Not on file  Other Topics Concern  . Not on file  Social History Narrative   Original from [place]       Family History  Problem Relation Age of Onset  . Coronary artery disease Mother        due to septic shock  . Heart attack Father 35  . Stroke Other        aunt  . Coronary artery disease Other        a number of uncles   . Diabetes Neg Hx   . Colon cancer Neg Hx   . Prostate cancer Neg Hx      Allergies as of 04/21/2018   No Known Allergies     Medication List        Accurate as of 04/21/18 11:59 PM. Always use your most recent med list.          ALPRAZolam 0.5 MG tablet Commonly known as:  XANAX Take 1 tablet (0.5 mg total) by mouth 2 (two) times daily as needed for anxiety.   aspirin 81 MG tablet Take 81 mg by mouth daily.   atorvastatin 40 MG tablet Commonly known as:  LIPITOR Take 1 tablet (40 mg total) by mouth daily.   fenofibrate  160 MG tablet Take 1 tablet (160 mg total) by mouth daily.   fish oil-omega-3 fatty acids 1000 MG capsule Take 2 g by mouth daily.   niacin 1000 MG CR tablet Commonly known as:  NIASPAN Take 1 tablet (1,000 mg total) by mouth at bedtime.          Objective:   Physical Exam BP 126/74 (BP Location: Left Arm, Patient Position: Sitting, Cuff Size: Normal)   Pulse 67   Temp 98.6 F (37 C) (Oral)   Resp 16   Ht 5\' 9"  (1.753 m)   Wt 223 lb 2 oz (101.2 kg)   SpO2 97%   BMI 32.95 kg/m  General: Well developed, NAD, see BMI.  Neck: No  thyromegaly  HEENT:  Normocephalic . Face symmetric, atraumatic Lungs:  CTA B Normal respiratory effort, no intercostal retractions, no accessory muscle use. Heart: RRR,  no murmur.  No pretibial edema bilaterally  Abdomen:  Not distended, soft, non-tender. No rebound or rigidity.   Skin: Exposed areas without rash. Not pale. Not jaundice Neurologic:  alert & oriented X3.  Speech normal, gait appropriate for age and unassisted Strength symmetric and appropriate for age.  Psych: Cognition and judgment appear intact.  Cooperative with normal attention span and concentration.   Behavior appropriate. No anxious or depressed appearing.     Assessment & Plan:   Assessment Hyperglycemia Dyslipidemia: Triglycerides up to the 1156, usually responding very well to exercise more than diet + FH CAD, father age 14, 2011- (-) GTX    Plan: Hyperglycemia: A1c last year 5.9, this year 6.2, A1c concept discussed with the patient.  We talked about diet, exercise and weight loss. Dyslipidemia: On and off compliance with Lipitor and fenofibrates.  diet and exercise fluctuates throughout the year.  Based on new information, Niaspan is not strongly recommended. LDL today is 140, goal 100 given family history.  Recommend to continue fenofibrate, increase Lipitor to 40 mg, watch for myalgias, okay to stop Niaspan. + FH CAD: Recommend cardiology referral for consideration of calcium coronary score. Anxiety, rarely uses Xanax. RTC 6 months

## 2018-04-22 ENCOUNTER — Encounter: Payer: Self-pay | Admitting: Internal Medicine

## 2018-04-22 DIAGNOSIS — R739 Hyperglycemia, unspecified: Secondary | ICD-10-CM

## 2018-04-22 DIAGNOSIS — E119 Type 2 diabetes mellitus without complications: Secondary | ICD-10-CM | POA: Insufficient documentation

## 2018-04-22 HISTORY — DX: Hyperglycemia, unspecified: R73.9

## 2018-04-22 NOTE — Assessment & Plan Note (Signed)
Hyperglycemia: A1c last year 5.9, this year 6.2, A1c concept discussed with the patient.  We talked about diet, exercise and weight loss. Dyslipidemia: On and off compliance with Lipitor and fenofibrates.  diet and exercise fluctuates throughout the year.  Based on new information, Niaspan is not strongly recommended. LDL today is 140, goal 100 given family history.  Recommend to continue fenofibrate, increase Lipitor to 40 mg, watch for myalgias, okay to stop Niaspan. + FH CAD: Recommend cardiology referral for consideration of calcium coronary score. Anxiety, rarely uses Xanax. RTC 6 months

## 2018-06-18 ENCOUNTER — Other Ambulatory Visit: Payer: Self-pay | Admitting: Internal Medicine

## 2018-06-20 MED ORDER — ATORVASTATIN CALCIUM 40 MG PO TABS: 40.0000 mg | ORAL_TABLET | Freq: Every day | ORAL | 3 refills | Status: DC

## 2018-07-22 ENCOUNTER — Encounter: Payer: Self-pay | Admitting: *Deleted

## 2018-08-11 ENCOUNTER — Ambulatory Visit (INDEPENDENT_AMBULATORY_CARE_PROVIDER_SITE_OTHER): Payer: 59 | Admitting: Cardiology

## 2018-08-11 ENCOUNTER — Encounter: Payer: Self-pay | Admitting: Cardiology

## 2018-08-11 VITALS — BP 110/80 | HR 61 | Ht 69.0 in | Wt 225.8 lb

## 2018-08-11 DIAGNOSIS — Z8249 Family history of ischemic heart disease and other diseases of the circulatory system: Secondary | ICD-10-CM | POA: Diagnosis not present

## 2018-08-11 DIAGNOSIS — Z79899 Other long term (current) drug therapy: Secondary | ICD-10-CM | POA: Diagnosis not present

## 2018-08-11 DIAGNOSIS — E7800 Pure hypercholesterolemia, unspecified: Secondary | ICD-10-CM

## 2018-08-11 MED ORDER — NIACIN ER (ANTIHYPERLIPIDEMIC) 1000 MG PO TBCR: 1000.0000 mg | EXTENDED_RELEASE_TABLET | Freq: Every day | ORAL | 3 refills | Status: DC

## 2018-08-11 NOTE — Progress Notes (Signed)
Cardiology Office Note:    Date:  08/11/2018   ID:  Wael Maestas, DOB Aug 24, 1966, MRN [id number]  PCP:  Colon Branch, MD  Cardiologist:  No primary care provider on file.  Electrophysiologist:  None   Referring MD: Colon Branch, MD     History of Present Illness:    Robert Soto is a 52 y.o. male here for evaluation of hypertriglyceridemia at the request of Dr. Larose Kells.  Triglycerides were as high as 900 and one point.  He has been off and on compliant with Lipitor and fenofibrate.  Niaspan was not strongly recommended.  LDL 140 goal 100 given family history continuing fiber rate increased Lipitor to 40 and stopped Niaspan.  With his family history of CAD recommended cardiology referral for may be calcium score. Father CAD 43, died. MI. Uncle with CABG.   Overall he has not had any significant chest pain.  No shortness of breath.  He has gained about 20 to 30 pounds after stopping running over the past 5 to 6 years.  He remembers seeing Porfirio Oar Pharm.D. in our lipid clinic.  Past Medical History:  Diagnosis Date  . High triglycerides    hx. up to 900s- was treated with vytorin (d/c when he was training for marathon & TG decreased)  . Hyperglycemia 04/22/2018    Past Surgical History:  Procedure Laterality Date  . TONSILLECTOMY  1976  . VASECTOMY  2012    Current Medications: Current Meds  Medication Sig  . ALPRAZolam (XANAX) 0.5 MG tablet Take 1 tablet (0.5 mg total) by mouth 2 (two) times daily as needed for anxiety.  Marland Kitchen aspirin 81 MG tablet Take 81 mg by mouth daily.  Marland Kitchen atorvastatin (LIPITOR) 40 MG tablet Take 1 tablet (40 mg total) by mouth daily.  . fenofibrate 160 MG tablet Take 1 tablet (160 mg total) by mouth daily.  . fish oil-omega-3 fatty acids 1000 MG capsule Take 2 g by mouth daily.   . niacin (NIASPAN) 1000 MG CR tablet Take 1 tablet (1,000 mg total) by mouth at bedtime.  . [DISCONTINUED] niacin (NIASPAN) 1000 MG CR tablet Take 1 tablet (1,000 mg total) by mouth  at bedtime.     Allergies:   Patient has no known allergies.   Social History   Socioeconomic History  . Marital status: Married    Spouse name: Not on file  . Number of children: 2  . Years of education: Not on file  . Highest education level: Not on file  Occupational History  . 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 Financial resource strain: Not on file  . Food insecurity:    Worry: Not on file    Inability: Not on file  . Transportation needs:    Medical: Not on file    Non-medical: Not on file  Tobacco Use  . Smoking status: Former Research scientist (life sciences)  . Smokeless tobacco: Never Used  Substance and Sexual Activity  . Alcohol use: Yes    Comment: socially   . Drug use: No  . Sexual activity: Not on file  Lifestyle  . Physical activity:    Days per week: Not on file    Minutes per session: Not on file  . Stress: Not on file  Relationships  . Social connections:    Talks on phone: Not on file    Gets together: Not on file    Attends religious service: Not on file    Active member of club or  organization: Not on file    Attends meetings of clubs or organizations: Not on file    Relationship status: Not on file  Other Topics Concern  . Not on file  Social History Narrative   Original from [place]     Family History: The patient's family history includes Coronary artery disease in his mother and other; Heart attack (age of onset: 42) in his father; Stroke in his other. There is no history of Diabetes, Colon cancer, or Prostate cancer.  ROS:   Please see the history of present illness.     All other systems reviewed and are negative.  EKGs/Labs/Other Studies Reviewed:    The following studies were reviewed today:   EKG:  EKG is  ordered today.  The ekg ordered today demonstrates sinus rhythm sinus arrhythmia 60 with no other abnormalities personally reviewed.  Recent Labs: 04/21/2018: ALT 27; BUN 18; Creatinine, Ser 1.10; Hemoglobin 15.5; Platelets 246.0;  Potassium 3.9; Sodium 138; TSH 2.29  Recent Lipid Panel    Component Value Date/Time   CHOL 230 (H) 04/21/2018 0803   CHOL 274 (H) 12/09/2011 0744   TRIG 278.0 (H) 04/21/2018 0803   TRIG 1,156 (H) 12/09/2011 0744   HDL 43.50 04/21/2018 0803   HDL 37 (L) 12/09/2011 0744   CHOLHDL 5 04/21/2018 0803   VLDL 55.6 (H) 04/21/2018 0803   LDLCALC 102 (H) 07/12/2014 0745   LDLCALC DEL 12/09/2011 0744   LDLDIRECT 140.0 04/21/2018 0803    Physical Exam:    VS:  BP 110/80   Pulse 61   Ht 5\' 9"  (1.753 m)   Wt 225 lb 12.8 oz (102.4 kg)   BMI 33.34 kg/m     Wt Readings from Last 3 Encounters:  08/11/18 225 lb 12.8 oz (102.4 kg)  04/21/18 223 lb 2 oz (101.2 kg)  10/20/16 221 lb 2 oz (100.3 kg)     GEN:  Well nourished, well developed in no acute distress HEENT: Normal NECK: No JVD; No carotid bruits LYMPHATICS: No lymphadenopathy CARDIAC: RRR, no murmurs, rubs, gallops RESPIRATORY:  Clear to auscultation without rales, wheezing or rhonchi  ABDOMEN: Soft, non-tender, non-distended MUSCULOSKELETAL:  No edema; No deformity  SKIN: Warm and dry NEUROLOGIC:  Alert and oriented x 3 PSYCHIATRIC:  Normal affect   ASSESSMENT:    1. Family history of coronary artery disease   2. Long-term use of high-risk medication   3. Hypercholesteremia    PLAN:    In order of problems listed above:  Familial hypertriglyceridemia - Has been as high as 900 in the past. - For him, I do think that Niaspan or niacin is valuable to help lower his triglycerides and continue to take fish oil and fenofibrate.  Continue with atorvastatin as well.  Continue to work on dietary modifications. Will restart Niacin daily.  We can always utilize her lipid clinic if necessary.  Family history of CAD -Coronary calcium score requested and ordered.  If high risk, greater than 400, stress test.  Continue with weight loss.  He has gained about 20 to 30 pounds since stopping running.  Medication Adjustments/Labs and  Tests Ordered: Current medicines are reviewed at length with the patient today.  Concerns regarding medicines are outlined above.  Orders Placed This Encounter  Procedures  . CT CARDIAC SCORING  . ALT  . Lipid panel  . EKG 12-Lead   Meds ordered this encounter  Medications  . niacin (NIASPAN) 1000 MG CR tablet    Sig: Take 1 tablet (  1,000 mg total) by mouth at bedtime.    Dispense:  90 tablet    Refill:  3    Patient Instructions  Medication Instructions:  The current medical regimen is effective;  continue present plan and medications.  If you need a refill on your cardiac medications before your next appointment, please call your pharmacy.   Lab work: Please have blood work in 2 months. (Lipd.ALT)  If you have labs (blood work) drawn today and your tests are completely normal, you will receive your results only by: Marland Kitchen MyChart Message (if you have MyChart) OR . A paper copy in the mail If you have any lab test that is abnormal or we need to change your treatment, we will call you to review the results.  Testing/Procedures: Your physician has requested that you have Calcium score.   Follow-Up: At [facility], you and your health needs are our priority.  As part of our continuing mission to provide you with exceptional heart care, we have created designated Provider Care Teams.  These Care Teams include your primary Cardiologist (physician) and Advanced Practice Providers (APPs -  Physician Assistants and Nurse Practitioners) who all work together to provide you with the care you need, when you need it. You will need a follow up appointment in 12 months.  Please call our office 2 months in advance to schedule this appointment.  You may see Dr Marlou Porch or one of the following Advanced Practice Providers on your designated Care Team:   Truitt Merle, NP Cecilie Kicks, NP . Kathyrn Drown, NP  Thank you for choosing [facility]!!          Signed, Candee Furbish, MD   08/11/2018 6:13 PM    [place]

## 2018-08-11 NOTE — Patient Instructions (Addendum)
Medication Instructions:  The current medical regimen is effective;  continue present plan and medications.  If you need a refill on your cardiac medications before your next appointment, please call your pharmacy.   Lab work: Please have blood work in 2 months. (Lipd.ALT)  If you have labs (blood work) drawn today and your tests are completely normal, you will receive your results only by: Marland Kitchen MyChart Message (if you have MyChart) OR . A paper copy in the mail If you have any lab test that is abnormal or we need to change your treatment, we will call you to review the results.  Testing/Procedures: Your physician has requested that you have Calcium score.   Follow-Up: At [facility], you and your health needs are our priority.  As part of our continuing mission to provide you with exceptional heart care, we have created designated Provider Care Teams.  These Care Teams include your primary Cardiologist (physician) and Advanced Practice Providers (APPs -  Physician Assistants and Nurse Practitioners) who all work together to provide you with the care you need, when you need it. You will need a follow up appointment in 12 months.  Please call our office 2 months in advance to schedule this appointment.  You may see Dr Marlou Porch or one of the following Advanced Practice Providers on your designated Care Team:   Truitt Merle, NP Cecilie Kicks, NP . Kathyrn Drown, NP  Thank you for choosing [facility]!!

## 2018-08-29 ENCOUNTER — Ambulatory Visit (INDEPENDENT_AMBULATORY_CARE_PROVIDER_SITE_OTHER)
Admission: RE | Admit: 2018-08-29 | Discharge: 2018-08-29 | Disposition: A | Discharge status: Home / Self Care | Payer: Self-pay | Source: Ambulatory Visit | Attending: Cardiology | Admitting: Cardiology

## 2018-08-29 DIAGNOSIS — Z8249 Family history of ischemic heart disease and other diseases of the circulatory system: Secondary | ICD-10-CM

## 2018-09-22 ENCOUNTER — Encounter: Payer: Self-pay | Admitting: *Deleted

## 2018-10-31 ENCOUNTER — Telehealth: Payer: Self-pay | Admitting: Internal Medicine

## 2018-10-31 ENCOUNTER — Ambulatory Visit: Payer: 59 | Admitting: Internal Medicine

## 2018-10-31 DIAGNOSIS — R739 Hyperglycemia, unspecified: Secondary | ICD-10-CM

## 2018-10-31 DIAGNOSIS — E781 Pure hyperglyceridemia: Secondary | ICD-10-CM

## 2018-10-31 NOTE — Telephone Encounter (Signed)
Spoke with the patient, he is doing well, has increased his physical activity lately and trying to eat healthier. Plan: Please cancel today's appointment Enter future labs to be done at [facility], fasting: FLP, AST, ALT, high cholesterol A1c: Hyperglycemia. Next visit: 7-20 20

## 2018-10-31 NOTE — Telephone Encounter (Signed)
Appt cancelled. Labs ordered.

## 2018-11-01 ENCOUNTER — Other Ambulatory Visit (INDEPENDENT_AMBULATORY_CARE_PROVIDER_SITE_OTHER): Payer: 59

## 2018-11-01 DIAGNOSIS — E781 Pure hyperglyceridemia: Secondary | ICD-10-CM

## 2018-11-01 DIAGNOSIS — R739 Hyperglycemia, unspecified: Secondary | ICD-10-CM | POA: Diagnosis not present

## 2018-11-01 LAB — LIPID PANEL
CHOL/HDL RATIO: 4
Cholesterol: 195 mg/dL (ref 0–200)
HDL: 45 mg/dL (ref >39.00)
LDL CALC: 112 mg/dL — AB (ref 0–99)
NONHDL: 150.43
Triglycerides: 192 mg/dL — ABNORMAL HIGH (ref 0.0–149.0)
VLDL: 38.4 mg/dL (ref 0.0–40.0)

## 2018-11-01 LAB — AST: AST: 28 U/L (ref 0–37)

## 2018-11-01 LAB — HEMOGLOBIN A1C: Hgb A1c MFr Bld: 6 % (ref 4.6–6.5)

## 2018-11-01 LAB — ALT: ALT: 37 U/L (ref 0–53)

## 2018-11-01 NOTE — Addendum Note (Signed)
Addended by: Boris Lown B on: 11/01/2018 07:46 AM   Modules accepted: Orders

## 2018-11-07 ENCOUNTER — Other Ambulatory Visit: Payer: 59

## 2019-04-29 ENCOUNTER — Encounter: Payer: Self-pay | Admitting: Internal Medicine

## 2019-08-09 ENCOUNTER — Other Ambulatory Visit: Payer: Self-pay

## 2019-08-09 DIAGNOSIS — Z20822 Contact with and (suspected) exposure to covid-19: Secondary | ICD-10-CM

## 2019-08-10 LAB — NOVEL CORONAVIRUS, NAA: SARS-CoV-2, NAA: NOT DETECTED

## 2019-08-24 ENCOUNTER — Other Ambulatory Visit: Payer: Self-pay

## 2019-08-25 ENCOUNTER — Ambulatory Visit (INDEPENDENT_AMBULATORY_CARE_PROVIDER_SITE_OTHER): Payer: 59 | Admitting: Internal Medicine

## 2019-08-25 ENCOUNTER — Encounter: Payer: Self-pay | Admitting: Internal Medicine

## 2019-08-25 VITALS — BP 121/73 | HR 52 | Temp 96.9°F | Resp 16 | Ht 69.0 in | Wt 211.0 lb

## 2019-08-25 DIAGNOSIS — E781 Pure hyperglyceridemia: Secondary | ICD-10-CM

## 2019-08-25 DIAGNOSIS — Z0000 Encounter for general adult medical examination without abnormal findings: Secondary | ICD-10-CM

## 2019-08-25 DIAGNOSIS — R739 Hyperglycemia, unspecified: Secondary | ICD-10-CM

## 2019-08-25 DIAGNOSIS — Z23 Encounter for immunization: Secondary | ICD-10-CM | POA: Diagnosis not present

## 2019-08-25 DIAGNOSIS — Z1211 Encounter for screening for malignant neoplasm of colon: Secondary | ICD-10-CM

## 2019-08-25 MED ORDER — ALPRAZOLAM 0.5 MG PO TABS: 0.5000 mg | ORAL_TABLET | Freq: Two times a day (BID) | ORAL | 0 refills | Status: DC | PRN

## 2019-08-25 NOTE — Progress Notes (Signed)
Subjective:    Patient ID: Robert Soto, male    DOB: 08/31/1966, 53 y.o.   MRN: [id number]  DOS:  08/25/2019 Type of visit - description: CPX No concerns Wt Readings from Last 3 Encounters:  08/25/19 211 lb (95.7 kg)  08/11/18 225 lb 12.8 oz (102.4 kg)  04/21/18 223 lb 2 oz (101.2 kg)    Review of Systems  A 14 point review of systems is negative    Past Medical History:  Diagnosis Date  . High triglycerides    hx. up to 900s- was treated with vytorin (d/c when he was training for marathon & TG decreased)  . Hyperglycemia 04/22/2018    Past Surgical History:  Procedure Laterality Date  . TONSILLECTOMY  1976  . VASECTOMY  2012    Social History   Socioeconomic History  . Marital status: Married    Spouse name: Not on file  . Number of children: 2  . Years of education: Not on file  . Highest education level: Not on file  Occupational History  . 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 Financial resource strain: Not on file  . Food insecurity    Worry: Not on file    Inability: Not on file  . Transportation needs    Medical: Not on file    Non-medical: Not on file  Tobacco Use  . Smoking status: Former Research scientist (life sciences)  . Smokeless tobacco: Never Used  Substance and Sexual Activity  . Alcohol use: Yes    Comment: socially   . Drug use: No  . Sexual activity: Not on file  Lifestyle  . Physical activity    Days per week: Not on file    Minutes per session: Not on file  . Stress: Not on file  Relationships  . Social Herbalist on phone: Not on file    Gets together: Not on file    Attends religious service: Not on file    Active member of club or organization: Not on file    Attends meetings of clubs or organizations: Not on file    Relationship status: Not on file  . Intimate partner violence    Fear of current or ex partner: Not on file    Emotionally abused: Not on file    Physically abused: Not on file    Forced sexual activity: Not  on file  Other Topics Concern  . Not on file  Social History Narrative   Original from [place]     Family History  Problem Relation Age of Onset  . Coronary artery disease Mother        due to septic shock  . Heart attack Father 57  . Stroke Other        aunt  . Coronary artery disease Other        a number of uncles   . Diabetes Neg Hx   . Colon cancer Neg Hx   . Prostate cancer Neg Hx      Allergies as of 08/25/2019   No Known Allergies     Medication List       Accurate as of August 25, 2019 11:59 PM. If you have any questions, ask your nurse or doctor.        STOP taking these medications   niacin 1000 MG CR tablet Commonly known as: Niaspan Stopped by: [name], MD     TAKE these medications   ALPRAZolam 0.5 MG tablet Commonly  known as: Xanax Take 1 tablet (0.5 mg total) by mouth 2 (two) times daily as needed for anxiety.   aspirin 81 MG tablet Take 81 mg by mouth daily.   atorvastatin 40 MG tablet Commonly known as: LIPITOR Take 1 tablet (40 mg total) by mouth daily.   fenofibrate 160 MG tablet Take 1 tablet (160 mg total) by mouth daily.   fish oil-omega-3 fatty acids 1000 MG capsule Take 2 g by mouth daily.           Objective:   Physical Exam BP 121/73 (BP Location: Left Arm, Patient Position: Sitting, Cuff Size: Normal)   Pulse (!) 52   Temp (!) 96.9 F (36.1 C) (Temporal)   Resp 16   Ht 5\' 9"  (1.753 m)   Wt 211 lb (95.7 kg)   SpO2 100%   BMI 31.16 kg/m   General: Well developed, NAD, BMI noted Neck: No  thyromegaly  HEENT:  Normocephalic . Face symmetric, atraumatic Lungs:  CTA B Normal respiratory effort, no intercostal retractions, no accessory muscle use. Heart: RRR,  no murmur.  No pretibial edema bilaterally  Abdomen:  Not distended, soft, non-tender. No rebound or rigidity.   Skin: Exposed areas without rash. Not pale. Not jaundice DRE: Normal sphincter tone, normal prostate, brown stools Neurologic:  alert  & oriented X3.  Speech normal, gait appropriate for age and unassisted Strength symmetric and appropriate for age.  Psych: Cognition and judgment appear intact.  Cooperative with normal attention span and concentration.  Behavior appropriate. No anxious or depressed appearing.     Assessment     Assessment Hyperglycemia Dyslipidemia: Triglycerides up to the 1156, usually responding very well to exercise more than diet + FH CAD, father age 31, 2011- (-) GTX    Plan: Here for CPX Hyperglycemia: Doing great with lifestyle, check A1c Dyslipidemia: Currently on Lipitor, fenofibrate, fish oil.  Checking labs FH CAD: Asymptomatic, saw cardiology 08-2018, coronary calcium score 0. Anxiety: Hardly ever using Xanax, Rx sent RTC 1 year   This visit occurred during the SARS-CoV-2 public health emergency.  Safety protocols were in place, including screening questions prior to the visit, additional usage of staff PPE, and extensive cleaning of exam room while observing appropriate contact time as indicated for disinfecting solutions.

## 2019-08-25 NOTE — Assessment & Plan Note (Signed)
Td 2011 Shingrix; discussed, will wait Flu shot today CCS: Options discussed, elected GI referral Prostate ca screening-DRE within normal, check a PSA His lifestyle is excellent, exercising vigorously frequently.  Good diet, has lost weight. + FH CAD, on ASA , Asx Labs: CMP, FLP, CBC, A1c, TSH, PSA

## 2019-08-25 NOTE — Patient Instructions (Signed)
   GO TO THE FRONT DESK Please go to our [place] location next week, fasting for blood work  Schedule your next appointment   for a physical exam   Call for refills as needed

## 2019-08-25 NOTE — Progress Notes (Signed)
Pre visit review using our clinic review tool, if applicable. No additional management support is needed unless otherwise documented below in the visit note. 

## 2019-08-27 NOTE — Assessment & Plan Note (Signed)
Here for CPX Hyperglycemia: Doing great with lifestyle, check A1c Dyslipidemia: Currently on Lipitor, fenofibrate, fish oil.  Checking labs FH CAD: Asymptomatic, saw cardiology 08-2018, coronary calcium score 0. Anxiety: Hardly ever using Xanax, Rx sent RTC 1 year

## 2019-08-28 ENCOUNTER — Encounter: Payer: Self-pay | Admitting: Gastroenterology

## 2019-08-29 ENCOUNTER — Other Ambulatory Visit (INDEPENDENT_AMBULATORY_CARE_PROVIDER_SITE_OTHER): Payer: 59

## 2019-08-29 DIAGNOSIS — Z125 Encounter for screening for malignant neoplasm of prostate: Secondary | ICD-10-CM

## 2019-08-29 DIAGNOSIS — E781 Pure hyperglyceridemia: Secondary | ICD-10-CM

## 2019-08-29 DIAGNOSIS — R739 Hyperglycemia, unspecified: Secondary | ICD-10-CM | POA: Diagnosis not present

## 2019-08-29 DIAGNOSIS — Z0000 Encounter for general adult medical examination without abnormal findings: Secondary | ICD-10-CM | POA: Diagnosis not present

## 2019-08-29 LAB — LIPID PANEL
Cholesterol: 203 mg/dL — ABNORMAL HIGH (ref 0–200)
HDL: 51.7 mg/dL (ref >39.00)
LDL Cholesterol: 120 mg/dL — ABNORMAL HIGH (ref 0–99)
NonHDL: 151.2
Total CHOL/HDL Ratio: 4
Triglycerides: 155 mg/dL — ABNORMAL HIGH (ref 0.0–149.0)
VLDL: 31 mg/dL (ref 0.0–40.0)

## 2019-08-29 LAB — CBC WITH DIFFERENTIAL/PLATELET
Basophils Absolute: 0 10*3/uL (ref 0.0–0.1)
Basophils Relative: 0.5 % (ref 0.0–3.0)
Eosinophils Absolute: 0.2 10*3/uL (ref 0.0–0.7)
Eosinophils Relative: 2.1 % (ref 0.0–5.0)
HCT: 44 % (ref 39.0–52.0)
Hemoglobin: 14.9 g/dL (ref 13.0–17.0)
Lymphocytes Relative: 25.1 % (ref 12.0–46.0)
Lymphs Abs: 2 10*3/uL (ref 0.7–4.0)
MCHC: 33.9 g/dL (ref 30.0–36.0)
MCV: 88.8 fl (ref 78.0–100.0)
Monocytes Absolute: 0.7 10*3/uL (ref 0.1–1.0)
Monocytes Relative: 9.2 % (ref 3.0–12.0)
Neutro Abs: 5 10*3/uL (ref 1.4–7.7)
Neutrophils Relative %: 63.1 % (ref 43.0–77.0)
Platelets: 250 10*3/uL (ref 150.0–400.0)
RBC: 4.95 Mil/uL (ref 4.22–5.81)
RDW: 13 % (ref 11.5–15.5)
WBC: 7.9 10*3/uL (ref 4.0–10.5)

## 2019-08-29 LAB — COMPREHENSIVE METABOLIC PANEL
ALT: 25 U/L (ref 0–53)
AST: 34 U/L (ref 0–37)
Albumin: 4.4 g/dL (ref 3.5–5.2)
Alkaline Phosphatase: 62 U/L (ref 39–117)
BUN: 22 mg/dL (ref 6–23)
CO2: 27 mEq/L (ref 19–32)
Calcium: 9.8 mg/dL (ref 8.4–10.5)
Chloride: 102 mEq/L (ref 96–112)
Creatinine, Ser: 1.13 mg/dL (ref 0.40–1.50)
GFR: 68.42 mL/min (ref >60.00)
Glucose, Bld: 101 mg/dL — ABNORMAL HIGH (ref 70–99)
Potassium: 4.3 mEq/L (ref 3.5–5.1)
Sodium: 138 mEq/L (ref 135–145)
Total Bilirubin: 0.6 mg/dL (ref 0.2–1.2)
Total Protein: 7.4 g/dL (ref 6.0–8.3)

## 2019-08-29 LAB — TSH: TSH: 1.97 u[IU]/mL (ref 0.35–4.50)

## 2019-08-29 LAB — PSA: PSA: 0.78 ng/mL (ref 0.10–4.00)

## 2019-08-29 LAB — HEMOGLOBIN A1C: Hgb A1c MFr Bld: 6 % (ref 4.6–6.5)

## 2019-09-08 ENCOUNTER — Encounter: Payer: 59 | Admitting: Internal Medicine

## 2019-09-14 ENCOUNTER — Other Ambulatory Visit: Payer: Self-pay | Admitting: Internal Medicine

## 2019-09-20 ENCOUNTER — Other Ambulatory Visit: Payer: Self-pay

## 2019-09-20 ENCOUNTER — Ambulatory Visit: Payer: 59 | Attending: Internal Medicine

## 2019-09-20 DIAGNOSIS — Z20822 Contact with and (suspected) exposure to covid-19: Secondary | ICD-10-CM

## 2019-09-22 LAB — NOVEL CORONAVIRUS, NAA: SARS-CoV-2, NAA: NOT DETECTED

## 2019-10-11 ENCOUNTER — Other Ambulatory Visit: Payer: Self-pay

## 2019-10-11 ENCOUNTER — Ambulatory Visit (AMBULATORY_SURGERY_CENTER): Payer: 59 | Admitting: *Deleted

## 2019-10-11 VITALS — Temp 97.1°F | Ht 69.0 in | Wt 214.0 lb

## 2019-10-11 DIAGNOSIS — Z1159 Encounter for screening for other viral diseases: Secondary | ICD-10-CM

## 2019-10-11 DIAGNOSIS — Z1211 Encounter for screening for malignant neoplasm of colon: Secondary | ICD-10-CM

## 2019-10-11 MED ORDER — SUPREP BOWEL PREP KIT 17.5-3.13-1.6 GM/177ML PO SOLN: 1.0000 | Freq: Once | ORAL | 0 refills | Status: AC

## 2019-10-11 NOTE — Progress Notes (Signed)
No egg or soy allergy known to patient  No issues with past sedation with any surgeries  or procedures, no intubation problems  No diet pills per patient No home 02 use per patient  No blood thinners per patient  Pt denies issues with constipation  No A fib or A flutter  EMMI video sent to pt's e mail  Suprep $15   Due to the COVID-19 pandemic we are asking patients to follow these guidelines. Please only bring one care partner. Please be aware that your care partner may wait in the car in the parking lot or if they feel like they will be too hot to wait in the car, they may wait in the lobby on the 4th floor. All care partners are required to wear a mask the entire time (we do not have any that we can provide them), they need to practice social distancing, and we will do a Covid check for all patient's and care partners when you arrive. Also we will check their temperature and your temperature. If the care partner waits in their car they need to stay in the parking lot the entire time and we will call them on their cell phone when the patient is ready for discharge so they can bring the car to the front of the building. Also all patient's will need to wear a mask into building.  Suprep $15

## 2019-10-17 ENCOUNTER — Encounter: Payer: Self-pay | Admitting: Gastroenterology

## 2019-10-24 ENCOUNTER — Telehealth: Payer: Self-pay | Admitting: Gastroenterology

## 2019-10-24 ENCOUNTER — Other Ambulatory Visit: Payer: Self-pay | Admitting: Gastroenterology

## 2019-10-24 ENCOUNTER — Ambulatory Visit (INDEPENDENT_AMBULATORY_CARE_PROVIDER_SITE_OTHER): Payer: 59

## 2019-10-24 DIAGNOSIS — Z1159 Encounter for screening for other viral diseases: Secondary | ICD-10-CM

## 2019-10-24 LAB — SARS CORONAVIRUS 2 (TAT 6-24 HRS): SARS Coronavirus 2: NEGATIVE

## 2019-10-24 NOTE — Telephone Encounter (Signed)
Called no answer- LM to return my call

## 2019-10-24 NOTE — Telephone Encounter (Signed)
Pt and I discussed new prep instructions for 1-21 Thursday   no foods Wednesday 1-20, Clear liquids only First prep 6 pm WED night, Thursday  No foods, clars only- 2nd prep 330 am complete all liquids by 530 am , nothing at all after 530 am Thursday - pt did have covid test today

## 2019-10-24 NOTE — Telephone Encounter (Signed)
Pt had to reschedule his colonoscopy due to missed covid test.  Please discuss new instructions with patient.

## 2019-10-25 ENCOUNTER — Encounter: Payer: 59 | Admitting: Gastroenterology

## 2019-10-26 ENCOUNTER — Encounter: Payer: Self-pay | Admitting: Gastroenterology

## 2019-10-26 ENCOUNTER — Ambulatory Visit (AMBULATORY_SURGERY_CENTER): Payer: 59 | Admitting: Gastroenterology

## 2019-10-26 ENCOUNTER — Other Ambulatory Visit: Payer: Self-pay

## 2019-10-26 VITALS — BP 107/68 | HR 49 | Temp 97.8°F | Resp 15 | Ht 69.0 in | Wt 214.0 lb

## 2019-10-26 DIAGNOSIS — D122 Benign neoplasm of ascending colon: Secondary | ICD-10-CM | POA: Diagnosis not present

## 2019-10-26 DIAGNOSIS — D123 Benign neoplasm of transverse colon: Secondary | ICD-10-CM

## 2019-10-26 DIAGNOSIS — Z1211 Encounter for screening for malignant neoplasm of colon: Secondary | ICD-10-CM

## 2019-10-26 MED ORDER — SODIUM CHLORIDE 0.9 % IV SOLN: 500.0000 mL | Freq: Once | INTRAVENOUS | Status: DC

## 2019-10-26 NOTE — Op Note (Signed)
[facility] Patient Name: Robert Soto Procedure Date: 10/26/2019 8:25 AM MRN: [id number] Endoscopist: Ladene Artist , MD Age: 54 Referring MD:  Date of Birth: [birth year] Gender: Male Account #: [id number] Procedure:                Colonoscopy Indications:              Screening for colorectal malignant neoplasm Medicines:                Monitored Anesthesia Care Procedure:                Pre-Anesthesia Assessment:                           - Prior to the procedure, a History and Physical                            was performed, and patient medications and                            allergies were reviewed. The patient's tolerance of                            previous anesthesia was also reviewed. The risks                            and benefits of the procedure and the sedation                            options and risks were discussed with the patient.                            All questions were answered, and informed consent                            was obtained. Prior Anticoagulants: The patient has                            taken no previous anticoagulant or antiplatelet                            agents. ASA Grade Assessment: II - A patient with                            mild systemic disease. After reviewing the risks                            and benefits, the patient was deemed in                            satisfactory condition to undergo the procedure.                           After obtaining informed consent, the colonoscope  was passed under direct vision. Throughout the                            procedure, the patient's blood pressure, pulse, and                            oxygen saturations were monitored continuously. The                            Colonoscope was introduced through the anus and                            advanced to the the cecum, identified by                            appendiceal orifice and  ileocecal valve. The                            ileocecal valve, appendiceal orifice, and rectum                            were photographed. The quality of the bowel                            preparation was excellent. The colonoscopy was                            performed without difficulty. The patient tolerated                            the procedure well. Scope In: 8:32:00 AM Scope Out: 8:49:38 AM Scope Withdrawal Time: 0 hours 13 minutes 28 seconds  Total Procedure Duration: 0 hours 17 minutes 38 seconds  Findings:                 The perianal and digital rectal examinations were                            normal.                           A 4 mm polyp was found in the ascending colon. The                            polyp was sessile. The polyp was removed with a                            cold biopsy forceps. Resection and retrieval were                            complete.                           A 6 mm polyp was found in the transverse colon. The  polyp was sessile. The polyp was removed with a                            cold snare. Resection and retrieval were complete.                           Internal hemorrhoids were found during                            retroflexion. The hemorrhoids were moderate and                            Grade I (internal hemorrhoids that do not prolapse).                           The exam was otherwise without abnormality on                            direct and retroflexion views. Complications:            No immediate complications. Estimated blood loss:                            None. Estimated Blood Loss:     Estimated blood loss: none. Impression:               - One 4 mm polyp in the ascending colon, removed                            with a cold biopsy forceps. Resected and retrieved.                           - One 6 mm polyp in the transverse colon, removed                            with a cold snare.  Resected and retrieved.                           - Internal hemorrhoids.                           - Otherwise without abnormality on forward and                            retroflexion views. Recommendation:           - Repeat colonoscopy after studies are complete for                            surveillance/ screening based on pathology results.                           - Patient has a contact number available for                            emergencies. The  signs and symptoms of potential                            delayed complications were discussed with the                            patient. Return to normal activities tomorrow.                            Written discharge instructions were provided to the                            patient.                           - Resume previous diet.                           - Continue present medications.                           - Await pathology results. Ladene Artist, MD 10/26/2019 8:53:11 AM This report has been signed electronically.

## 2019-10-26 NOTE — Progress Notes (Signed)
Called to room to assist during endoscopic procedure.  Patient ID and intended procedure confirmed with present staff. Received instructions for my participation in the procedure from the performing physician.  

## 2019-10-26 NOTE — Progress Notes (Signed)
A and O x3. Report to RN. Tolerated MAC anesthesia well.

## 2019-10-26 NOTE — Patient Instructions (Signed)
HANDOUTS given for polyps and hemorrhoids.  YOU HAD AN ENDOSCOPIC PROCEDURE TODAY AT THE [facility]:   Refer to the procedure report that was given to you for any specific questions about what was found during the examination.  If the procedure report does not answer your questions, please call your gastroenterologist to clarify.  If you requested that your care partner not be given the details of your procedure findings, then the procedure report has been included in a sealed envelope for you to review at your convenience later.  YOU SHOULD EXPECT: Some feelings of bloating in the abdomen. Passage of more gas than usual.  Walking can help get rid of the air that was put into your GI tract during the procedure and reduce the bloating. If you had a lower endoscopy (such as a colonoscopy or flexible sigmoidoscopy) you may notice spotting of blood in your stool or on the toilet paper. If you underwent a bowel prep for your procedure, you may not have a normal bowel movement for a few days.  Please Note:  You might notice some irritation and congestion in your nose or some drainage.  This is from the oxygen used during your procedure.  There is no need for concern and it should clear up in a day or so.  SYMPTOMS TO REPORT IMMEDIATELY:   Following lower endoscopy (colonoscopy or flexible sigmoidoscopy):  Excessive amounts of blood in the stool  Significant tenderness or worsening of abdominal pains  Swelling of the abdomen that is new, acute  Fever of 100F or higher  For urgent or emergent issues, a gastroenterologist can be reached at any hour by calling [phone].   DIET:  We do recommend a small meal at first, but then you may proceed to your regular diet.  Drink plenty of fluids but you should avoid alcoholic beverages for 24 hours.  ACTIVITY:  You should plan to take it easy for the rest of today and you should NOT DRIVE or use heavy machinery until tomorrow (because of the  sedation medicines used during the test).    FOLLOW UP: Our staff will call the number listed on your records 48-72 hours following your procedure to check on you and address any questions or concerns that you may have regarding the information given to you following your procedure. If we do not reach you, we will leave a message.  We will attempt to reach you two times.  During this call, we will ask if you have developed any symptoms of COVID 19. If you develop any symptoms (ie: fever, flu-like symptoms, shortness of breath, cough etc.) before then, please call [phone].  If you test positive for Covid 19 in the 2 weeks post procedure, please call and report this information to us.    If any biopsies were taken you will be contacted by phone or by letter within the next 1-3 weeks.  Please call us at [phone] if you have not heard about the biopsies in 3 weeks.    SIGNATURES/CONFIDENTIALITY: You and/or your care partner have signed paperwork which will be entered into your electronic medical record.  These signatures attest to the fact that that the information above on your After Visit Summary has been reviewed and is understood.  Full responsibility of the confidentiality of this discharge information lies with you and/or your care-partner. 

## 2019-10-26 NOTE — Progress Notes (Signed)
Pt's states no medical or surgical changes since previsit or office visit.  Jb temp, DT vitals and JG Iv.

## 2019-10-30 ENCOUNTER — Telehealth: Payer: Self-pay | Admitting: *Deleted

## 2019-10-30 ENCOUNTER — Telehealth: Payer: Self-pay

## 2019-10-30 NOTE — Telephone Encounter (Signed)
1st follow up call made.  NALM 

## 2019-10-30 NOTE — Telephone Encounter (Signed)
No answer for second post procedure call back left message for patient to call with questions and concerns. SM

## 2019-10-31 ENCOUNTER — Ambulatory Visit: Payer: 59 | Attending: Internal Medicine

## 2019-10-31 ENCOUNTER — Other Ambulatory Visit: Payer: 59

## 2019-10-31 DIAGNOSIS — Z20822 Contact with and (suspected) exposure to covid-19: Secondary | ICD-10-CM

## 2019-11-01 ENCOUNTER — Encounter: Payer: Self-pay | Admitting: Gastroenterology

## 2019-11-01 LAB — NOVEL CORONAVIRUS, NAA: SARS-CoV-2, NAA: NOT DETECTED

## 2019-11-27 ENCOUNTER — Other Ambulatory Visit: Payer: Self-pay

## 2019-11-27 MED ORDER — FENOFIBRATE 160 MG PO TABS: 160.0000 mg | ORAL_TABLET | Freq: Every day | ORAL | 3 refills | Status: DC

## 2019-11-27 MED ORDER — ATORVASTATIN CALCIUM 40 MG PO TABS: 40.0000 mg | ORAL_TABLET | Freq: Every day | ORAL | 3 refills | Status: DC

## 2019-12-20 ENCOUNTER — Ambulatory Visit: Payer: 59 | Attending: Internal Medicine

## 2019-12-20 DIAGNOSIS — Z20822 Contact with and (suspected) exposure to covid-19: Secondary | ICD-10-CM

## 2019-12-21 LAB — NOVEL CORONAVIRUS, NAA: SARS-CoV-2, NAA: NOT DETECTED

## 2020-01-18 ENCOUNTER — Encounter: Payer: Self-pay | Admitting: Internal Medicine

## 2020-01-18 ENCOUNTER — Ambulatory Visit: Payer: 59 | Admitting: Internal Medicine

## 2020-01-18 ENCOUNTER — Other Ambulatory Visit: Payer: Self-pay

## 2020-01-18 VITALS — BP 111/72 | HR 62 | Temp 96.5°F | Resp 18 | Ht 69.0 in | Wt 209.5 lb

## 2020-01-18 DIAGNOSIS — M542 Cervicalgia: Secondary | ICD-10-CM | POA: Diagnosis not present

## 2020-01-18 MED ORDER — MELOXICAM 7.5 MG PO TABS: 7.5000 mg | ORAL_TABLET | Freq: Every day | ORAL | 0 refills | Status: DC | PRN

## 2020-01-18 NOTE — Progress Notes (Signed)
Subjective:    Patient ID: Robert Soto, male    DOB: 1966/09/15, 54 y.o.   MRN: [id number]  DOS:  01/18/2020 Type of visit - description: acute Symptoms started approximately 3 months ago. On and off pain located at the posterior left neck encompassing the trapezoid area, see graphic. The pain is typically triggered by certain head or neck movements or when he turns or changes position  in bed. Admits to some stress &  working long hours in front of the computer. He tried Flexeril a couple of times and did not help. Ibuprofen does decrease the pain  Review of Systems Denies any injury although few months ago started to exercise more at the gym. No radiation down to the arms No upper or lower extremities numbness or motor deficits.   Past Medical History:  Diagnosis Date  . Allergy   . High triglycerides    hx. up to 900s- was treated with vytorin (d/c when he was training for marathon & TG decreased)  . Hyperglycemia 04/22/2018    Past Surgical History:  Procedure Laterality Date  . TONSILLECTOMY  1976  . VASECTOMY  2012    Allergies as of 01/18/2020   No Known Allergies     Medication List       Accurate as of January 18, 2020 11:59 PM. If you have any questions, ask your nurse or doctor.        ALPRAZolam 0.5 MG tablet Commonly known as: Xanax Take 1 tablet (0.5 mg total) by mouth 2 (two) times daily as needed for anxiety.   aspirin 81 MG tablet Take 81 mg by mouth daily.   atorvastatin 40 MG tablet Commonly known as: LIPITOR Take 1 tablet (40 mg total) by mouth daily.   cetirizine 10 MG tablet Commonly known as: ZYRTEC Take 10 mg by mouth daily. In the cold weather   fenofibrate 160 MG tablet Take 1 tablet (160 mg total) by mouth daily.   fish oil-omega-3 fatty acids 1000 MG capsule Take 2 g by mouth daily.   meloxicam 7.5 MG tablet Commonly known as: MOBIC Take 1-2 tablets (7.5-15 mg total) by mouth daily as needed for pain. Started by: [name], MD   POTASSIUM PO Take by mouth. 3 days a week when running   VITAMIN B 12 PO Take by mouth. When he runs 3 days a week   vitamin C 1000 MG tablet Take 1,000 mg by mouth daily.          Objective:   Physical Exam Neck:     BP 111/72 (BP Location: Left Arm, Patient Position: Sitting, Cuff Size: Normal)   Pulse 62   Temp (!) 96.5 F (35.8 C) (Temporal)   Resp 18   Ht 5\' 9"  (1.753 m)   Wt 209 lb 8 oz (95 kg)   SpO2 100%   BMI 30.94 kg/m  General:   Well developed, NAD, BMI noted. HEENT:  Normocephalic . Face symmetric, atraumatic Neck: Range of motion normal, no TTP of the cervical spine. Lower extremities: no pretibial edema bilaterally  Skin: Not pale. Not jaundice Neurologic:  alert & oriented X3.  Speech normal, gait appropriate for age and unassisted. Motor symmetric, DTRs symmetric, slightly less prominent in the lower extremities Psych--  Cognition and judgment appear intact.  Cooperative with normal attention span and concentration.  Behavior appropriate. No anxious or depressed appearing.      Assessment      Assessment Hyperglycemia Dyslipidemia: Triglycerides up  to the 1156, usually responding very well to exercise more than diet + FH CAD, father age 56, 2011- (-) GTX    Plan: Neck pain: Started 3 months ago, no clear-cut radiculopathy symptoms, neuro exam is benign. Ibuprofen helped, Flexeril did not. Plan: Meloxicam for 10 days then as needed, GI precautions discussed. Heating pad Posture while typing discussed  Refer to PT. If not improving, he will call in few days for a round of prednisone (he had a Covid vaccination 10 days ago, avoid prednisone for now). If not improving the next few weeks he will need orthopedic referral.  This visit occurred during the SARS-CoV-2 public health emergency.  Safety protocols were in place, including screening questions prior to the visit, additional usage of staff PPE, and extensive cleaning of  exam room while observing appropriate contact time as indicated for disinfecting solutions.

## 2020-01-18 NOTE — Progress Notes (Signed)
Pre visit review using our clinic review tool, if applicable. No additional management support is needed unless otherwise documented below in the visit note. 

## 2020-01-18 NOTE — Patient Instructions (Signed)
Take meloxicam 7.51 or 2 tablets daily for 10 days, then as needed  Always take it with food because may cause gastritis and ulcers.  If you notice nausea, stomach pain, change in the color of stools --->  Stop the medicine and let us know   Heating pad  We will refer you to physical therapy  In 10 days, if you do not see any progress  please call me.

## 2020-01-19 NOTE — Assessment & Plan Note (Signed)
Neck pain: Started 3 months ago, no clear-cut radiculopathy symptoms, neuro exam is benign. Ibuprofen helped, Flexeril did not. Plan: Meloxicam for 10 days then as needed, GI precautions discussed. Heating pad Posture while typing discussed  Refer to PT. If not improving, he will call in few days for a round of prednisone (he had a Covid vaccination 10 days ago, avoid prednisone for now). If not improving the next few weeks he will need orthopedic referral.

## 2020-01-28 IMAGING — CT CT HEART SCORING
2 series · 16 of 20 positions shown, 18 images · non-contrast
Comparison: None.

Addendum:
CLINICAL DATA: Risk stratification

EXAM:
Coronary Calcium Score
TECHNIQUE: The patient was scanned on a Siemens Force scanner. Axial
non-contrast 3 mm slices were carried out through the heart. The
data set was analyzed on a dedicated work station and scored using
the Agatson method.
MEDICATIONS:
None.

[Series 2: casc 3.0 i36f 2 bestdiast 68 % · axial · 0.41mm/px · z∈[-223,-112]mm · 8 of 49 slices shown, 10 images]
[im 6/49  vessel]
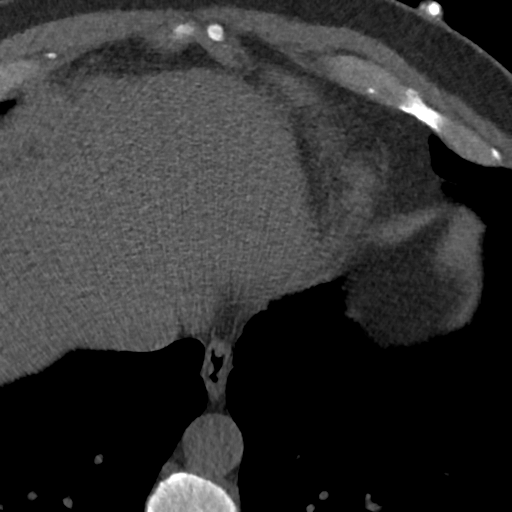
[im 6/49  lung]
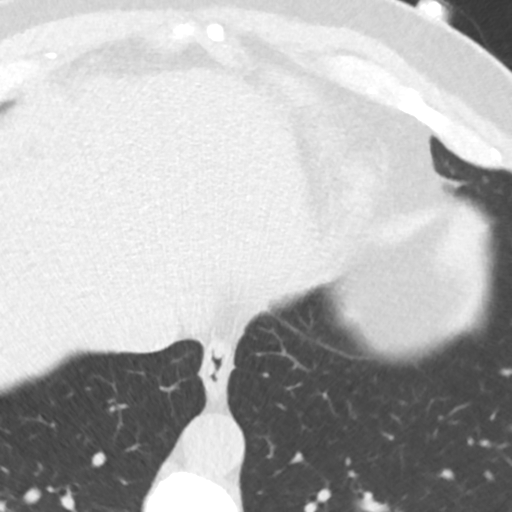
[im 11/49  vessel]
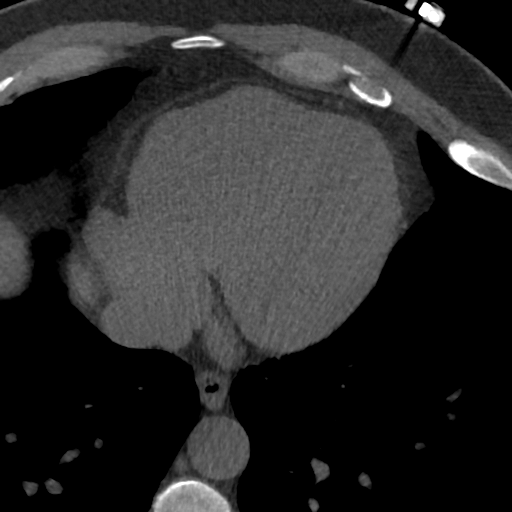
[im 17/49  vessel]
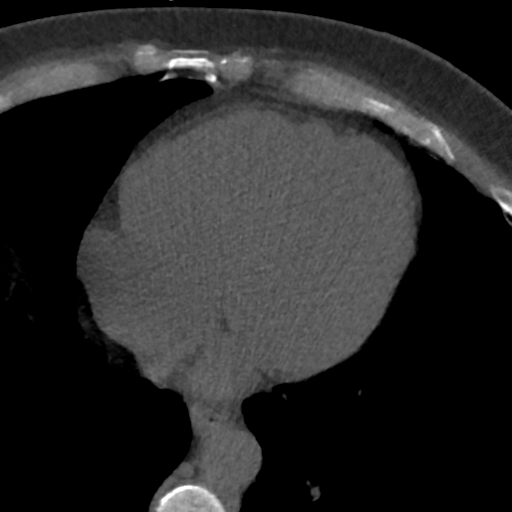
[im 22/49  vessel]
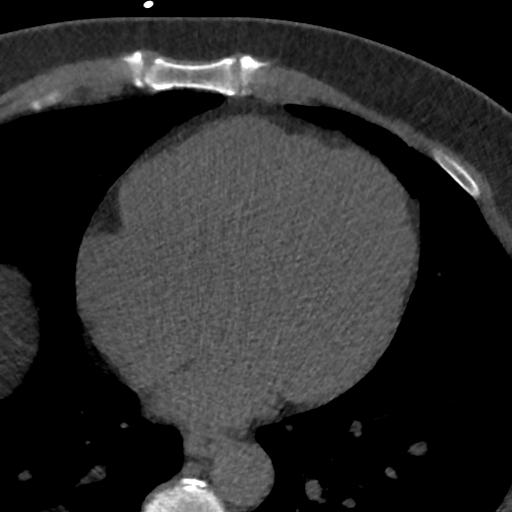
[im 27/49  vessel]
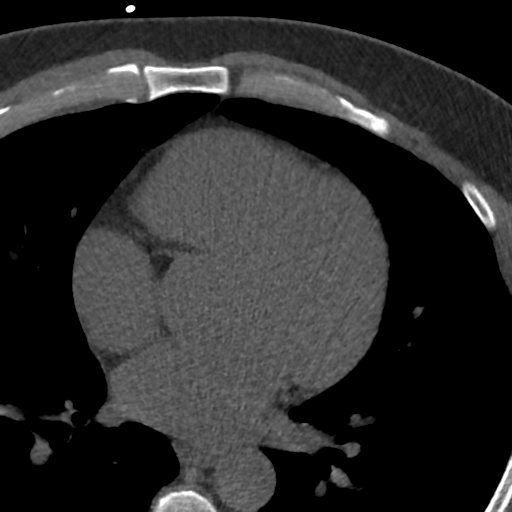
[im 27/49  lung]
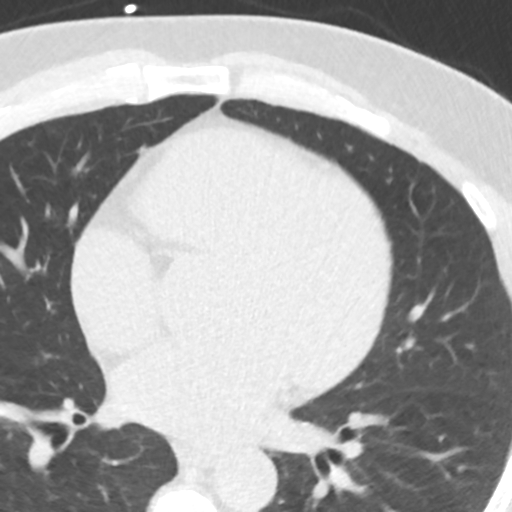
[im 33/49  vessel]
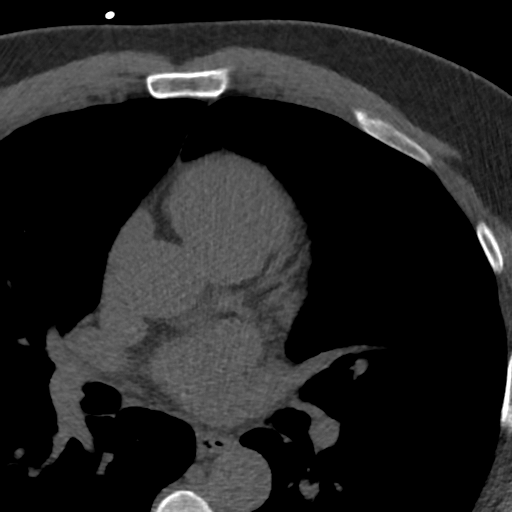
[im 38/49  vessel]
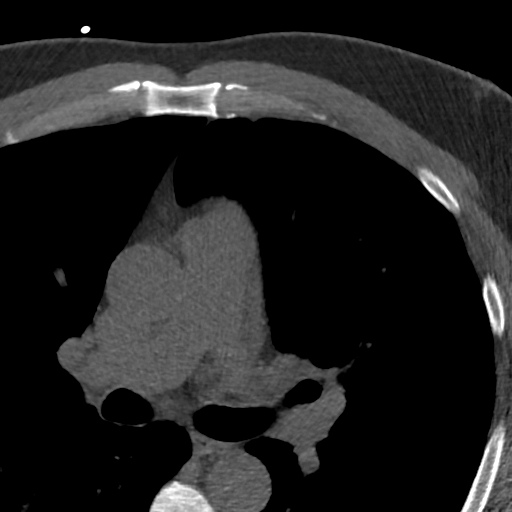
[im 43/49  vessel]
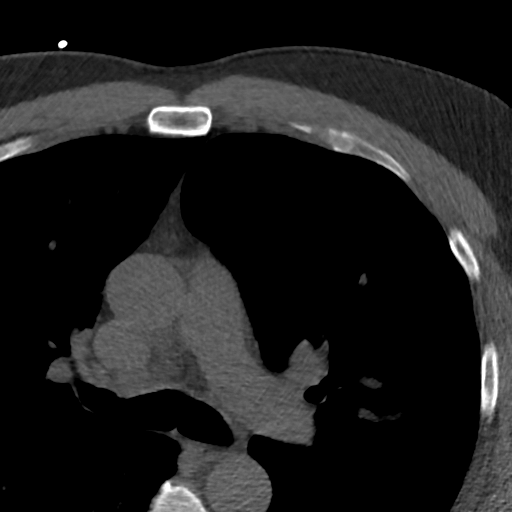

[Series 4: lung st 67 % · axial · 0.78mm/px · z∈[-222,-112]mm · 8 of 49 slices shown]
[im 6/49  lung]
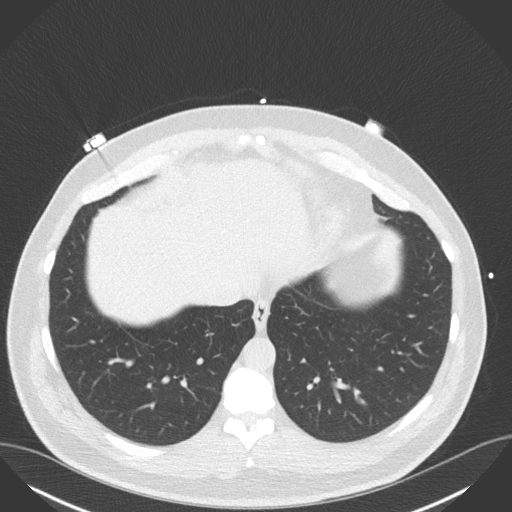
[im 11/49  lung]
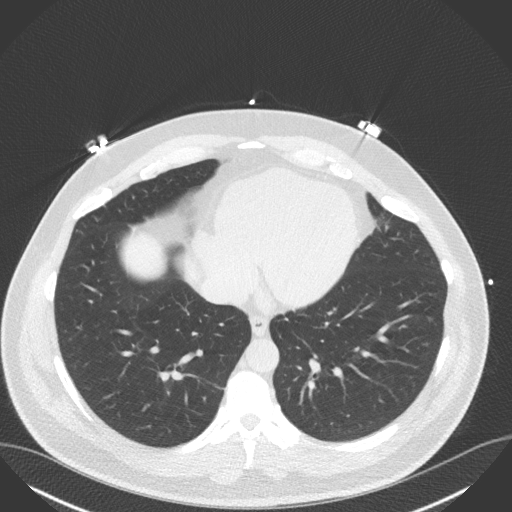
[im 17/49  lung]
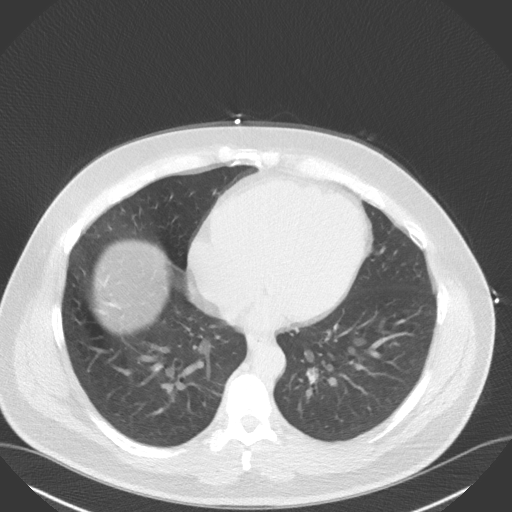
[im 22/49  lung]
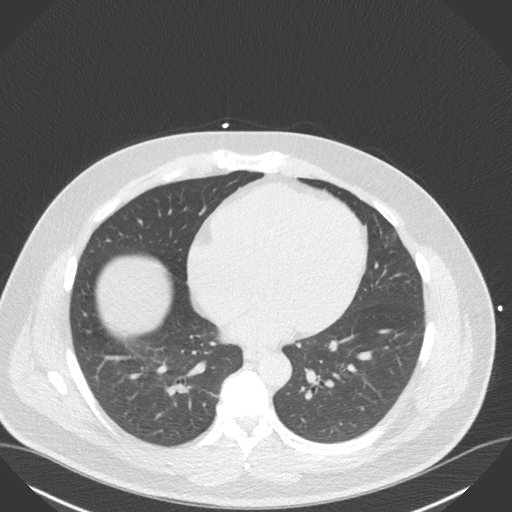
[im 27/49  lung]
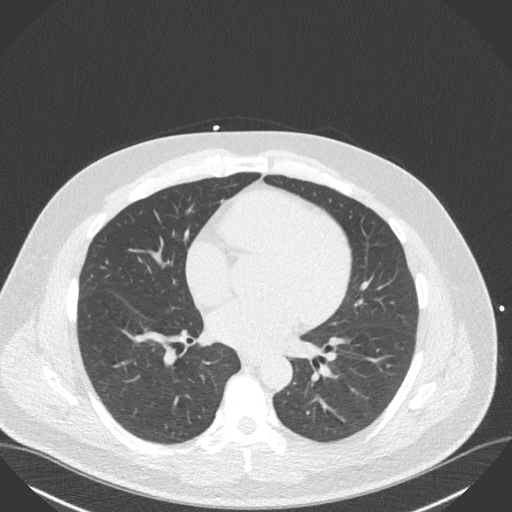
[im 33/49  lung]
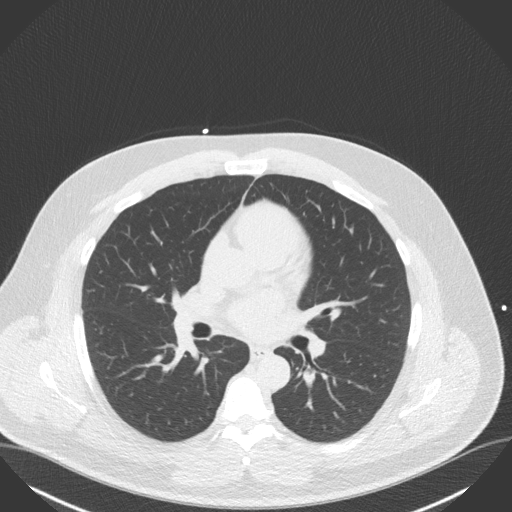
[im 38/49  lung]
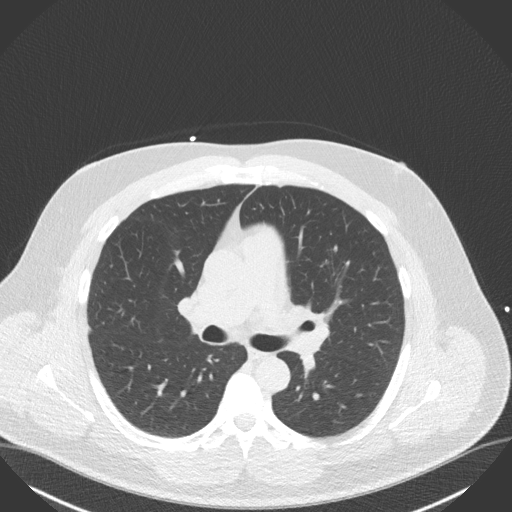
[im 43/49  lung]
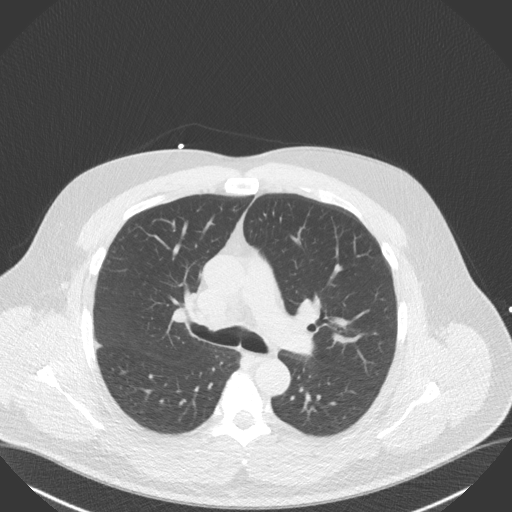

[16 of 20 positions shown; findings below may reference images not displayed]

FINDINGS: Non-cardiac: See separate report from [REDACTED].

Ascending Aorta: Normal size, no calcifications.

Pericardium: Normal.

Coronary arteries: Normal origin.
IMPRESSION: Coronary calcium score of 0. This was 0 percentile for age and sex
matched control.

Parsa Marie

EXAM:
OVER-READ INTERPRETATION  CT CHEST

The following report is an over-read performed by radiologist Dr.
over-read does not include interpretation of cardiac or coronary
anatomy or pathology. The coronary calcium score interpretation by
the cardiologist is attached.
FINDINGS: The visualized portions of the lower airway appear patent. No
mediastinal adenopathy or mass identified.

No pleural effusion, airspace consolidation or atelectasis. No
suspicious pulmonary nodule.

No acute findings within the visualized portions of the upper
abdomen.

The visualized osseous structures are unremarkable.
IMPRESSION: 1. Negative over-read.

*** End of Addendum ***

## 2020-02-08 ENCOUNTER — Ambulatory Visit: Payer: 59 | Attending: Internal Medicine | Admitting: Physical Therapy

## 2020-02-08 ENCOUNTER — Other Ambulatory Visit: Payer: Self-pay

## 2020-02-08 ENCOUNTER — Encounter: Payer: Self-pay | Admitting: Physical Therapy

## 2020-02-08 DIAGNOSIS — R293 Abnormal posture: Secondary | ICD-10-CM | POA: Diagnosis present

## 2020-02-08 DIAGNOSIS — M542 Cervicalgia: Secondary | ICD-10-CM | POA: Insufficient documentation

## 2020-02-08 NOTE — Therapy (Signed)
[facility] [address], [place], [place], [address] Phone: [phone]   Fax:  [phone]  Physical Therapy Evaluation  Patient Details  Name: Robert Soto MRN: [id number] Date of Birth: March 09, 1966 Referring Provider (PT): Colon Branch, MD   Encounter Date: 02/08/2020  PT End of Session - 02/08/20 0756    Visit Number  1    Date for PT Re-Evaluation  05/02/20    PT Start Time  0756    PT Stop Time  0839    PT Time Calculation (min)  43 min    Activity Tolerance  Patient tolerated treatment well    Behavior During Therapy  [facility] for tasks assessed/performed       Past Medical History:  Diagnosis Date  . Allergy   . High triglycerides    hx. up to 900s- was treated with vytorin (d/c when he was training for marathon & TG decreased)  . Hyperglycemia 04/22/2018    Past Surgical History:  Procedure Laterality Date  . TONSILLECTOMY  1976  . VASECTOMY  2012    There were no vitals filed for this visit.   Subjective Assessment - 02/08/20 0801    Subjective  Pt states he has pain for a few months now and it start in upper thoracic areas and comes up to the base of the head. Prior to this    Patient Stated Goals  get back to running    Currently in Pain?  Yes    Pain Score  3     Pain Location  Neck    Pain Orientation  Mid    Pain Descriptors / Indicators  Tightness;Aching    Pain Type  Acute pain    Pain Onset  More than a month ago    Pain Frequency  Intermittent    Aggravating Factors   leaning forward like looking at the phone when lifting head after that it hurts    Pain Relieving Factors  heat    Effect of Pain on Daily Activities  afraid to run         [facility] PT Assessment - 02/08/20 0001      Assessment   Medical Diagnosis  M54.2 (ICD-10-CM) - Neck pain    Referring Provider (PT)  Colon Branch, MD    Hand Dominance  Right      Precautions   Precautions  None      Restrictions   Weight  Bearing Restrictions  No      Balance Screen   Has the patient fallen in the past 6 months  No      [place] residence    Living Arrangements  Spouse/significant other;Children   2 children 1 college     Prior Function   Level of Independence  Independent    Vocation  Full time employment    Vocation Requirements  working at [employer] for tasks assessed      Observation/Other Assessments   Focus on Therapeutic Outcomes (FOTO)   44%      Posture/Postural Control   Posture/Postural Control  Postural limitations    Postural Limitations  Rounded Shoulders;Forward head;Increased thoracic kyphosis      ROM / Strength   AROM / PROM / Strength  AROM;Strength      AROM   Overall AROM Comments  shoulder WNL  AROM Assessment Site  Cervical    Cervical Flexion  WNL    Cervical Extension  40    Cervical - Right Side Bend  5    Cervical - Left Side Bend  5    Cervical - Right Rotation  35    Cervical - Left Rotation  35      Strength   Overall Strength Comments  shoulders 5/5 bilateral      Palpation   Palpation comment  tight Lt>Rt: cervical paraspinals, suboccipitals, SCM, upper traps; hypomobility T3-1      Special Tests    Special Tests  Cervical    Cervical Tests  Spurling's;Dictraction      Spurling's   Findings  Negative      Distraction Test   Findngs  Negative      Ambulation/Gait   Gait Pattern  Within Functional Limits                Objective measurements completed on examination: See above findings.      [facility] PT Treatment/Exercise - 02/08/20 0001      Self-Care   Self-Care  Other Self-Care Comments    Other Self-Care Comments   posture and initial HEP      Exercises   Exercises  Neck      Neck Exercises: Supine   Neck Retraction  5 reps;5 secs    Other Supine Exercise  thoracic ext with towel       Trigger Point Dry  Needling - 02/08/20 0001    Consent Given?  Yes    Education Handout Provided  Yes    Other Dry Needling  c2 multifidi Lt side; Lt suboccipitials - increased muscle length palpated           PT Education - 02/08/20 0847    Education Details  Access Code: [email or web address]    Person(s) Educated  Patient    Methods  Explanation;Demonstration;Verbal cues;Handout    Comprehension  Verbalized understanding;Returned demonstration       PT Short Term Goals - 02/08/20 0841      PT SHORT TERM GOAL #1   Title  pt will be ind with initial HEP    Time  4    Period  Weeks    Status  New    Target Date  03/07/20      PT SHORT TERM GOAL #2   Title  Pt will improve cervical ROM to 40 deg bilat    Baseline  35 deg    Time  4    Period  Weeks    Status  New    Target Date  03/07/20      PT SHORT TERM GOAL #3   Title  Pt will be able to run with 50% less pain    Time  4    Period  Weeks    Status  New    Target Date  03/07/20        PT Long Term Goals - 02/08/20 0845      PT LONG TERM GOAL #1   Title  Pt will be ind with advanced HEP    Time  12    Period  Weeks    Status  New    Target Date  05/02/20      PT LONG TERM GOAL #2   Title  Pt will report he is able to return to his normal exercise routine without increased pain    Time  12    Period  Weeks    Status  New    Target Date  05/02/20      PT LONG TERM GOAL #3   Title  FOTO 29% or less limited    Time  12    Period  Weeks    Status  New    Target Date  05/02/20      PT LONG TERM GOAL #4   Title  Cervical rotation improved to 45 deg without pain bilaterally    Time  12    Period  Weeks    Status  New    Target Date  05/02/20             Plan - 02/08/20 0848    Clinical Impression Statement  Pt presents to clinic due to neck pain that began several months ago.  Pt has forward head, rounded shoulder and increased kyphosis of upper thoracic spine.  He is very stiff in her cervical movements with about 65%  limited cervical sidebending and rotation.  Pt has muscle spasm suboccipitals, cervical paraspinals, SCM Lt>Rt.  Pt has shoulder strength and ROM WNL. He will benefit from skilled PT to address these impairments that appear to be from overuse and chronic in nature with more recent onset of pain.  Pt has negative Spurling and decompression test.  Cervical spine mobility may become more apparent as muscle tension is released.    Personal Factors and Comorbidities  Time since onset of injury/illness/exacerbation;Profession    Stability/Clinical Decision Making  Stable/Uncomplicated    Clinical Decision Making  Low    Rehab Potential  Excellent    PT Frequency  2x / week   may be less due to travels a lot for work   PT Duration  12 weeks    PT Treatment/Interventions  ADLs/Self Care Home Management;Biofeedback;Cryotherapy;Electrical Stimulation;Moist Heat;Traction;Neuromuscular re-education;Therapeutic exercise;Therapeutic activities;Patient/family education;Manual techniques;Dry needling;Passive range of motion;Taping    PT Next Visit Plan  f/u on dry needling and HEP from eval, start on UBE; upper trap and pec stretches; posture, DN/STM to upper traps, cervical multifidi, oblique capitus, suboccipitals, SCM as needed    PT Home Exercise Plan  Access Code: [email or web address]    Consulted and Agree with Plan of Care  Patient       Patient will benefit from skilled therapeutic intervention in order to improve the following deficits and impairments:  Pain, Hypomobility, Postural dysfunction  Visit Diagnosis: Cervicalgia  Abnormal posture     Problem List Patient Active Problem List   Diagnosis Date Noted  . Hyperglycemia 04/22/2018  . PCP NOTES >>>>>>>>>>>>>>>>> 10/22/2016  . Annual physical exam 05/05/2013  . HYPERTRIGLYCERIDEMIA 08/05/2009    Jule Ser, PT 02/08/2020, 9:09 AM  [facility] [address] [place], [place],  [address] Phone: [phone]   Fax:  [phone]  Name: Robert Soto MRN: [id number] Date of Birth: 06-07-1966

## 2020-02-08 NOTE — Patient Instructions (Signed)
Access Code: [email or web address] URL: [email or web address] Date: 02/08/2020 Prepared by: Jari Favre  Exercises Supine Chin Tuck - 1 x daily - 7 x weekly - 10 reps - 3 sets Thoracic Extension Mobilization with Noodle - 1 x daily - 7 x weekly - 10 reps - 3 sets Seated Diaphragmatic Breathing - 3 x daily - 7 x weekly - 10 reps - 1 sets  Patient Education Trigger Point Dry Needling

## 2020-02-13 ENCOUNTER — Other Ambulatory Visit: Payer: Self-pay

## 2020-02-13 ENCOUNTER — Ambulatory Visit: Payer: 59

## 2020-02-13 DIAGNOSIS — M542 Cervicalgia: Secondary | ICD-10-CM

## 2020-02-13 DIAGNOSIS — R293 Abnormal posture: Secondary | ICD-10-CM

## 2020-02-13 NOTE — Therapy (Signed)
[facility] [address], [place], [place], [address] Phone: [phone]   Fax:  [phone]  Physical Therapy Treatment  Patient Details  Name: Robert Soto MRN: [id number] Date of Birth: Nov 23, 1965 Referring Provider (Robert Soto): Colon Branch, MD   Encounter Date: 02/13/2020  Robert Soto End of Session - 02/13/20 0807    Visit Number  2    Date for Robert Soto Re-Evaluation  05/02/20    Authorization Type  UHC    Authorization - Visit Number  2    Authorization - Number of Visits  23    Robert Soto Start Time  0727    Robert Soto Stop Time  0802    Robert Soto Time Calculation (min)  35 min    Activity Tolerance  Patient tolerated treatment well    Behavior During Therapy  [facility] for tasks assessed/performed       Past Medical History:  Diagnosis Date  . Allergy   . High triglycerides    hx. up to 900s- was treated with vytorin (d/c when he was training for marathon & TG decreased)  . Hyperglycemia 04/22/2018    Past Surgical History:  Procedure Laterality Date  . TONSILLECTOMY  1976  . VASECTOMY  2012    There were no vitals filed for this visit.  Subjective Assessment - 02/13/20 0728    Subjective  I felt really good after dry needling.    Currently in Pain?  Yes    Pain Score  3     Pain Location  Neck    Pain Orientation  Mid    Pain Descriptors / Indicators  Aching;Tightness    Pain Type  Acute pain    Pain Onset  More than a month ago    Pain Frequency  Intermittent    Aggravating Factors   unknown, looking at phone    Pain Relieving Factors  heat, dry needling, stretching.                       [place] [facility] Robert Soto Treatment/Exercise - 02/13/20 0001      Neck Exercises: Prone   Other Prone Exercise  quadrant rotation x10 each      Manual Therapy   Manual Therapy  Soft tissue mobilization;Myofascial release    Manual therapy comments  palpation and assessment and tissue mobization with dry needling      Neck Exercises:  Stretches   Upper Trapezius Stretch  3 reps;30 seconds;Right;Left    Corner Stretch  3 reps;20 seconds       Trigger Point Dry Needling - 02/13/20 0001    Consent Given?  Yes    Education Handout Provided  Previously provided    Muscles Treated Head and Neck  Upper trapezius;Oblique capitus;Suboccipitals;Cervical multifidi    Upper Trapezius Response  Twitch reponse elicited;Palpable increased muscle length    Oblique Capitus Response  Twitch response elicited;Palpable increased muscle length    Suboccipitals Response  Twitch response elicited;Palpable increased muscle length    Cervical multifidi Response  Twitch reponse elicited;Palpable increased muscle length           Robert Soto Education - 02/13/20 0744    Education Details  Access Code: [email or web address]    Person(s) Educated  Patient    Methods  Explanation;Demonstration;Handout    Comprehension  Verbalized understanding;Returned demonstration       Robert Soto Short Term Goals - 02/08/20 0841      Robert Soto SHORT TERM GOAL #1   Title  Robert Soto will be ind with initial HEP    Time  4    Period  Weeks    Status  New    Target Date  03/07/20      Robert Soto SHORT TERM GOAL #2   Title  Robert Soto will improve cervical ROM to 40 deg bilat    Baseline  35 deg    Time  4    Period  Weeks    Status  New    Target Date  03/07/20      Robert Soto SHORT TERM GOAL #3   Title  Robert Soto will be able to run with 50% less pain    Time  4    Period  Weeks    Status  New    Target Date  03/07/20        Robert Soto Long Term Goals - 02/08/20 0845      Robert Soto LONG TERM GOAL #1   Title  Robert Soto will be ind with advanced HEP    Time  12    Period  Weeks    Status  New    Target Date  05/02/20      Robert Soto LONG TERM GOAL #2   Title  Robert Soto will report he is able to return to his normal exercise routine without increased pain    Time  12    Period  Weeks    Status  New    Target Date  05/02/20      Robert Soto LONG TERM GOAL #3   Title  FOTO 29% or less limited    Time  12    Period  Weeks    Status  New     Target Date  05/02/20      Robert Soto LONG TERM GOAL #4   Title  Cervical rotation improved to 45 deg without pain bilaterally    Time  12    Period  Weeks    Status  New    Target Date  05/02/20            Plan - 02/13/20 0811    Clinical Impression Statement  Robert Soto with first time follow-up after evaluation.  Session focused on review of HEP, addition of upper trap and pectoralis stretches and dry needling to address muscle tension.  Robert Soto with trigger points in bil neck, suboccipitals and upper traps and demonstrated improved tissue mobility after manual and dry needling today.  Robert Soto will continue to benefit from skilled Robert Soto to address neck pain, strength, posture and alignment.    Robert Soto Frequency  2x / week    Robert Soto Duration  12 weeks    Robert Soto Treatment/Interventions  ADLs/Self Care Home Management;Biofeedback;Cryotherapy;Electrical Stimulation;Moist Heat;Traction;Neuromuscular re-education;Therapeutic exercise;Therapeutic activities;Patient/family education;Manual techniques;Dry needling;Passive range of motion;Taping    Robert Soto Next Visit Plan  follow-up on dry needling and repeat if helpful, add postural strength    Robert Soto Home Exercise Plan  Access Code: [email or web address]    Recommended Other Services  initial cert is signed    Consulted and Agree with Plan of Care  Patient       Patient will benefit from skilled therapeutic intervention in order to improve the following deficits and impairments:  Pain, Hypomobility, Postural dysfunction  Visit Diagnosis: Cervicalgia  Abnormal posture     Problem List Patient Active Problem List   Diagnosis Date Noted  . Hyperglycemia 04/22/2018  . PCP NOTES >>>>>>>>>>>>>>>>> 10/22/2016  . Annual physical exam 05/05/2013  . HYPERTRIGLYCERIDEMIA 08/05/2009    Robert Soto, Robert Soto  02/13/20 8:14 AM  [facility] [address], [place], [place], [address] Phone: [phone]   Fax:  [phone]  Name: Robert Soto MRN: [id number] Date of Birth: 08/27/1966

## 2020-02-13 NOTE — Patient Instructions (Signed)
Access Code: [email or web address] URL: [email or web address] Date: 02/13/2020 Prepared by: Claiborne Billings  Exercises Supine Chin Tuck - 1 x daily - 7 x weekly - 10 reps - 3 sets Thoracic Extension Mobilization with Noodle - 1 x daily - 7 x weekly - 10 reps - 3 sets Seated Diaphragmatic Breathing - 3 x daily - 7 x weekly - 10 reps - 1 sets Doorway Pec Stretch at 90 Degrees Abduction - 2 x daily - 7 x weekly - 1 sets - 3 reps - 20 hold Standing Upper Trapezius Stretch - 3 x daily - 7 x weekly - 1 sets - 10 reps - 20 hold Cervical Rotation Prone on Elbows - 3 x daily - 7 x weekly - 1 sets - 10 reps - 3 hold  Patient Education Trigger Point Dry Needling

## 2020-02-22 ENCOUNTER — Ambulatory Visit: Payer: 59

## 2020-02-22 ENCOUNTER — Other Ambulatory Visit: Payer: Self-pay

## 2020-02-22 DIAGNOSIS — M542 Cervicalgia: Secondary | ICD-10-CM

## 2020-02-22 DIAGNOSIS — R293 Abnormal posture: Secondary | ICD-10-CM

## 2020-02-22 NOTE — Therapy (Signed)
[facility] [address] [place], [place], [address] Phone: [phone]   Fax:  [phone]  Physical Therapy Treatment  Patient Details  Name: Robert Soto MRN: [id number] Date of Birth: Feb 06, 1966 Referring Provider (PT): Robert Branch, MD   Encounter Date: 02/22/2020  PT End of Session - 02/22/20 1244    Visit Number  3    Date for PT Re-Evaluation  05/02/20    Authorization Type  UHC    Authorization - Visit Number  3    Authorization - Number of Visits  23    PT Start Time  [id number]    PT Stop Time  1228    PT Time Calculation (min)  42 min    Activity Tolerance  Patient tolerated treatment well    Behavior During Therapy  [facility] for tasks assessed/performed       Past Medical History:  Diagnosis Date  . Allergy   . High triglycerides    hx. up to 900s- was treated with vytorin (d/c when he was training for marathon & TG decreased)  . Hyperglycemia 04/22/2018    Past Surgical History:  Procedure Laterality Date  . TONSILLECTOMY  1976  . VASECTOMY  2012    There were no vitals filed for this visit.  Subjective Assessment - 02/22/20 1149    Subjective  My neck is feeling 80% better.  I'm not taking muscle relaxers anymore.    Patient Stated Goals  get back to running    Currently in Pain?  No/denies         [facility] PT Assessment - 02/22/20 0001      AROM   Cervical - Right Side Bend  30    Cervical - Left Side Bend  30    Cervical - Right Rotation  55    Cervical - Left Rotation  55                    [facility] Adult PT Treatment/Exercise - 02/22/20 0001      Manual Therapy   Manual Therapy  Soft tissue mobilization;Myofascial release    Manual therapy comments  palpation and assessment and tissue mobization with dry needling      Neck Exercises: Stretches   Upper Trapezius Stretch  3 reps;30 seconds;Right;Left    Corner Stretch  3 reps;20 seconds    Other Neck Stretches  cervical  rotation 3x20 seconds               PT Short Term Goals - 02/22/20 1150      PT SHORT TERM GOAL #1   Title  pt will be ind with initial HEP    Status  Achieved      PT SHORT TERM GOAL #2   Title  Pt will improve cervical ROM to 40 deg bilat    Baseline  55 bil    Status  Achieved      PT SHORT TERM GOAL #3   Title  Pt will be able to run with 50% less pain    Baseline  able to run without increased pain    Status  Achieved        PT Long Term Goals - 02/22/20 1200      PT LONG TERM GOAL #4   Title  Cervical rotation improved to 45 deg without pain bilaterally    Baseline  55    Status  Achieved  Plan - 02/22/20 1242    Clinical Impression Statement  Pt reports 80% overall improvement in symptoms since the start of care.  Pt is not taking muscle relaxers anymore.  Pt demonstrated significant improvement in sidebending and rotation cervical A/ROM.  Pt with improved postural awareness and is making corrections at home and work.  Pt with tension and trigger points in bil neck and upper traps and demonstrated improved tissue mobility and segmental mobility after manual therapy today.  Pt will continue to benefit from skilled PT to address neck pain, flexibility and posture.    PT Frequency  2x / week    PT Duration  12 weeks    PT Treatment/Interventions  ADLs/Self Care Home Management;Biofeedback;Cryotherapy;Electrical Stimulation;Moist Heat;Traction;Neuromuscular re-education;Therapeutic exercise;Therapeutic activities;Patient/family education;Manual techniques;Dry needling;Passive range of motion;Taping    PT Next Visit Plan  follow-up on dry needling and repeat if helpful, add postural strength.  This was not added today due to pt not consistent with current HEP    PT Home Exercise Plan  Access Code: [email or web address]    Consulted and Agree with Plan of Care  Patient       Patient will benefit from skilled therapeutic intervention in order to improve the  following deficits and impairments:  Pain, Hypomobility, Postural dysfunction  Visit Diagnosis: Abnormal posture  Cervicalgia     Problem List Patient Active Problem List   Diagnosis Date Noted  . Hyperglycemia 04/22/2018  . PCP NOTES >>>>>>>>>>>>>>>>> 10/22/2016  . Annual physical exam 05/05/2013  . HYPERTRIGLYCERIDEMIA 08/05/2009    Robert Soto, PT 02/22/20 12:46 PM  [name] [address] [place], [place], [address] Phone: [phone]   Fax:  [phone]  Name: Robert Soto MRN: [id number] Date of Birth: June 30, 1966

## 2020-03-07 ENCOUNTER — Other Ambulatory Visit: Payer: Self-pay

## 2020-03-07 ENCOUNTER — Ambulatory Visit: Payer: 59 | Attending: Internal Medicine

## 2020-03-07 DIAGNOSIS — M542 Cervicalgia: Secondary | ICD-10-CM | POA: Diagnosis present

## 2020-03-07 DIAGNOSIS — R293 Abnormal posture: Secondary | ICD-10-CM | POA: Diagnosis present

## 2020-03-07 NOTE — Therapy (Signed)
[facility] [address], [place], [place], [address] Phone: [phone]   Fax:  [phone]  Physical Therapy Treatment  Patient Details  Name: Robert Soto MRN: [id number] Date of Birth: 01/23/1966 Referring Provider (PT): Colon Branch, MD   Encounter Date: 03/07/2020  PT End of Session - 03/07/20 1226    Visit Number  4    Date for PT Re-Evaluation  05/02/20    Authorization Type  UHC    Authorization - Visit Number  4    Authorization - Number of Visits  23    PT Start Time  1146   dry needling   PT Stop Time  1224    PT Time Calculation (min)  38 min    Activity Tolerance  Patient tolerated treatment well    Behavior During Therapy  [facility] for tasks assessed/performed       Past Medical History:  Diagnosis Date  . Allergy   . High triglycerides    hx. up to 900s- was treated with vytorin (d/c when he was training for marathon & TG decreased)  . Hyperglycemia 04/22/2018    Past Surgical History:  Procedure Laterality Date  . TONSILLECTOMY  1976  . VASECTOMY  2012    There were no vitals filed for this visit.  Subjective Assessment - 03/07/20 1148    Subjective  I have been traveling.  I ran 2 weeks ago and my neck was great.  I ran 1 week ago and I had some neck pain after.  Now things are good.    Currently in Pain?  No/denies                        [facility] Adult PT Treatment/Exercise - 03/07/20 0001      Manual Therapy   Manual Therapy  Soft tissue mobilization;Myofascial release    Manual therapy comments  palpation and assessment and tissue mobization with dry needling    Soft tissue mobilization  elongation to bil upper traps, cervical paraspinals and suboccipitals       Trigger Point Dry Needling - 03/07/20 0001    Consent Given?  Yes    Muscles Treated Head and Neck  Upper trapezius;Oblique capitus;Suboccipitals;Cervical multifidi    Upper Trapezius Response  Twitch  reponse elicited;Palpable increased muscle length    Oblique Capitus Response  Twitch response elicited;Palpable increased muscle length    Suboccipitals Response  Twitch response elicited;Palpable increased muscle length    Cervical multifidi Response  Twitch reponse elicited;Palpable increased muscle length             PT Short Term Goals - 03/07/20 1149      PT SHORT TERM GOAL #1   Title  pt will be ind with initial HEP    Status  Achieved      PT SHORT TERM GOAL #3   Title  Pt will be able to run with 50% less pain    Status  Achieved        PT Long Term Goals - 03/07/20 1150      PT LONG TERM GOAL #2   Title  Pt will report he is able to return to his normal exercise routine without increased pain    Baseline  not able to do because of schedule    Time  12    Period  Weeks    Status  On-going      PT LONG TERM  GOAL #4   Title  Cervical rotation improved to 45 deg without pain bilaterally    Baseline  55    Status  Achieved            Plan - 03/07/20 1225    Clinical Impression Statement  Pt has had a lapse in treatment due to travel with work.  Pt has not been consistent with HEP so additional exercises were not added today.  Pt continues to report 80% overall improvement in symptoms since the start of care.  Pt has had pain only with running 1 time earlier this week.  Pt has not returned to his regular exercise routine due to time constraints.  Pt with tension in bil neck and upper traps although significantly reduced since the start of care.  Pt with improved tissue mobility and report of reduced stiffness by the patient.  Pt will continue to benefit from skilled PT to address cervical flexibility, postural strength and neck pain.    PT Frequency  2x / week    PT Duration  12 weeks    PT Treatment/Interventions  ADLs/Self Care Home Management;Biofeedback;Cryotherapy;Electrical Stimulation;Moist Heat;Traction;Neuromuscular re-education;Therapeutic  exercise;Therapeutic activities;Patient/family education;Manual techniques;Dry needling;Passive range of motion;Taping    PT Next Visit Plan  assess response to dry needling, repeat if helpful.  Add postural strength to HEP    PT Home Exercise Plan  Access Code: [email or web address]    Consulted and Agree with Plan of Care  Patient       Patient will benefit from skilled therapeutic intervention in order to improve the following deficits and impairments:     Visit Diagnosis: Abnormal posture  Cervicalgia     Problem List Patient Active Problem List   Diagnosis Date Noted  . Hyperglycemia 04/22/2018  . PCP NOTES >>>>>>>>>>>>>>>>> 10/22/2016  . Annual physical exam 05/05/2013  . HYPERTRIGLYCERIDEMIA 08/05/2009     Robert Soto, PT 03/07/20 12:28 PM  [facility] [address] [place], [place], [address] Phone: [phone]   Fax:  [phone]  Name: Robert Soto MRN: [id number] Date of Birth: 02-14-1966

## 2020-03-11 ENCOUNTER — Ambulatory Visit: Payer: 59 | Admitting: Physical Therapy

## 2020-03-11 ENCOUNTER — Other Ambulatory Visit: Payer: Self-pay

## 2020-03-11 ENCOUNTER — Encounter: Payer: Self-pay | Admitting: Physical Therapy

## 2020-03-11 DIAGNOSIS — R293 Abnormal posture: Secondary | ICD-10-CM | POA: Diagnosis not present

## 2020-03-11 DIAGNOSIS — M542 Cervicalgia: Secondary | ICD-10-CM

## 2020-03-11 NOTE — Therapy (Signed)
[facility] [address], [place], [place], [address] Phone: [phone]   Fax:  [phone]  Physical Therapy Treatment  Patient Details  Name: Robert Soto MRN: [id number] Date of Birth: 1965-10-12 Referring Provider (PT): Colon Branch, MD   Encounter Date: 03/11/2020  PT End of Session - 03/11/20 0849    Visit Number  5    Date for PT Re-Evaluation  05/02/20    Authorization Type  UHC    Authorization - Visit Number  5    Authorization - Number of Visits  23    PT Start Time  0848    PT Stop Time  0928    PT Time Calculation (min)  40 min    Activity Tolerance  Patient tolerated treatment well    Behavior During Therapy  [facility] for tasks assessed/performed       Past Medical History:  Diagnosis Date  . Allergy   . High triglycerides    hx. up to 900s- was treated with vytorin (d/c when he was training for marathon & TG decreased)  . Hyperglycemia 04/22/2018    Past Surgical History:  Procedure Laterality Date  . TONSILLECTOMY  1976  . VASECTOMY  2012    There were no vitals filed for this visit.  Subjective Assessment - 03/11/20 0851    Subjective  The pain came back last night but before that it was great. Pain came on while watching TV last night.    Patient Stated Goals  get back to running    Currently in Pain?  Yes    Pain Score  4     Pain Location  Neck    Pain Orientation  Mid    Pain Descriptors / Indicators  Aching    Pain Type  Acute pain    Aggravating Factors   turning head    Multiple Pain Sites  No                        [facility] Adult PT Treatment/Exercise - 03/11/20 0001      Neck Exercises: Machines for Strengthening   UBE (Upper Arm Bike)  L      Neck Exercises: Standing   Other Standing Exercises  UE shoulder ext red and blue band 20x cues for posture, scap depression and core activation    Other Standing Exercises  diaphragmatic breathing in front of the  mirror cues to keep shoulders relaxed      Neck Exercises: Prone   Other Prone Exercise  quadruped with correct posture - UE reaches      Manual Therapy   Manual Therapy  Soft tissue mobilization    Manual therapy comments  palpation and assessment and tissue mobization with dry needling    Soft tissue mobilization  elongation to bil upper traps, cervical paraspinals and suboccipitals       Trigger Point Dry Needling - 03/11/20 0001    Consent Given?  Yes    Education Handout Provided  Previously provided    Upper Trapezius Response  Twitch reponse elicited;Palpable increased muscle length    Oblique Capitus Response  Twitch response elicited;Palpable increased muscle length    Suboccipitals Response  Twitch response elicited;Palpable increased muscle length    Cervical multifidi Response  Twitch reponse elicited;Palpable increased muscle length           PT Education - 03/11/20 0936    Education Details  Access Code: [email or web address]  Person(s) Educated  Patient    Methods  Explanation;Demonstration;Tactile cues;Verbal cues;Handout    Comprehension  Verbalized understanding;Returned demonstration       PT Short Term Goals - 03/07/20 1149      PT SHORT TERM GOAL #1   Title  pt will be ind with initial HEP    Status  Achieved      PT SHORT TERM GOAL #3   Title  Pt will be able to run with 50% less pain    Status  Achieved        PT Long Term Goals - 03/07/20 1150      PT LONG TERM GOAL #2   Title  Pt will report he is able to return to his normal exercise routine without increased pain    Baseline  not able to do because of schedule    Time  12    Period  Weeks    Status  On-going      PT LONG TERM GOAL #4   Title  Cervical rotation improved to 45 deg without pain bilaterally    Baseline  55    Status  Achieved            Plan - 03/11/20 0933    Clinical Impression Statement  Pt needs cues to keep shoulder down and back.  He was educated in diaphragmatic  breathing due to tendency to use accessory breathing muscles which cause his cervical muscles to activate too much.  Pt responded well to dry needling and had improved AROM reported feeling better after treatment today.  Pt will benefit from skilled PT to continue to work on trunk stability with improved posture for return to normal activities.    PT Treatment/Interventions  ADLs/Self Care Home Management;Biofeedback;Cryotherapy;Electrical Stimulation;Moist Heat;Traction;Neuromuscular re-education;Therapeutic exercise;Therapeutic activities;Patient/family education;Manual techniques;Dry needling;Passive range of motion;Taping    PT Next Visit Plan  assess response to dry needling, repeat if helpful. progress postural and core strength with focus on shoulder and neck position    PT Home Exercise Plan  Access Code: [email or web address]       Patient will benefit from skilled therapeutic intervention in order to improve the following deficits and impairments:  Pain, Hypomobility, Postural dysfunction  Visit Diagnosis: Abnormal posture  Cervicalgia     Problem List Patient Active Problem List   Diagnosis Date Noted  . Hyperglycemia 04/22/2018  . PCP NOTES >>>>>>>>>>>>>>>>> 10/22/2016  . Annual physical exam 05/05/2013  . HYPERTRIGLYCERIDEMIA 08/05/2009    Jule Ser, PT 03/11/2020, 9:36 AM  [facility] [address], [place], [place], [address] Phone: [phone]   Fax:  [phone]  Name: Robert Soto MRN: [id number] Date of Birth: [birth year]

## 2020-03-11 NOTE — Patient Instructions (Signed)
Access Code: [email or web address] URL: [email or web address] Date: 03/11/2020 Prepared by: Jari Favre  Exercises Supine Chin Tuck - 1 x daily - 7 x weekly - 10 reps - 3 sets Thoracic Extension Mobilization with Noodle - 1 x daily - 7 x weekly - 10 reps - 3 sets Seated Diaphragmatic Breathing - 3 x daily - 7 x weekly - 10 reps - 1 sets Doorway Pec Stretch at 90 Degrees Abduction - 2 x daily - 7 x weekly - 1 sets - 3 reps - 20 hold Standing Upper Trapezius Stretch - 3 x daily - 7 x weekly - 1 sets - 10 reps - 20 hold Cervical Rotation Prone on Elbows - 3 x daily - 7 x weekly - 1 sets - 10 reps - 3 hold Quadruped Alternating Arm Lift - 1 x daily - 7 x weekly - 10 reps - 2 sets Seated Diaphragmatic Breathing - 3 x daily - 7 x weekly - 10 reps - 1 sets  Patient Education Trigger Point Dry Needling

## 2020-03-14 ENCOUNTER — Ambulatory Visit: Payer: 59

## 2020-03-14 ENCOUNTER — Other Ambulatory Visit: Payer: Self-pay

## 2020-03-14 DIAGNOSIS — R293 Abnormal posture: Secondary | ICD-10-CM

## 2020-03-14 DIAGNOSIS — M542 Cervicalgia: Secondary | ICD-10-CM

## 2020-03-14 NOTE — Patient Instructions (Signed)
Access Code: [email or web address] URL: [email or web address] Date: 03/14/2020 Prepared by: Claiborne Billings  Exercises  Supine Shoulder Horizontal Abduction with Resistance - 2 x daily - 7 x weekly - 10 reps - 2 sets Supine Bilateral Shoulder External Rotation with Resistance - 2 x daily - 7 x weekly - 10 reps - 2 sets Supine PNF D2 Flexion with Resistance - 2 x daily - 7 x weekly - 10 reps - 2 sets

## 2020-03-14 NOTE — Therapy (Signed)
[facility] [address], [place], [place], [address] Phone: [phone]   Fax:  [phone]  Physical Therapy Treatment  Patient Details  Name: Robert Soto MRN: [id number] Date of Birth: 09/24/1966 Referring Provider (PT): Colon Branch, MD   Encounter Date: 03/14/2020   PT End of Session - 03/14/20 0805    Visit Number 6    Date for PT Re-Evaluation 05/02/20    Authorization Type UHC    Authorization - Visit Number 6    Authorization - Number of Visits 23    PT Start Time 0741    PT Stop Time 0811    PT Time Calculation (min) 30 min    Activity Tolerance Patient tolerated treatment well    Behavior During Therapy [facility] for tasks assessed/performed           Past Medical History:  Diagnosis Date  . Allergy   . High triglycerides    hx. up to 900s- was treated with vytorin (d/c when he was training for marathon & TG decreased)  . Hyperglycemia 04/22/2018    Past Surgical History:  Procedure Laterality Date  . TONSILLECTOMY  1976  . VASECTOMY  2012    There were no vitals filed for this visit.   Subjective Assessment - 03/14/20 0747    Subjective Pt is 10 minutes late.  I felt much better after dry needling last session.  I feel 90% better    Currently in Pain? No/denies    Pain Location Neck                             [facility] Adult PT Treatment/Exercise - 03/14/20 0001      Exercises   Exercises Shoulder      Neck Exercises: Machines for Strengthening   UBE (Upper Arm Bike) level 4 x 6 minutes (3/3)      Shoulder Exercises: Supine   Horizontal ABduction Strengthening;Both;20 reps;Theraband    Theraband Level (Shoulder Horizontal ABduction) Level 4 (Blue)    External Rotation Strengthening;Both;20 reps;Theraband    Theraband Level (Shoulder External Rotation) Level 4 (Blue)    Diagonals Strengthening;Both;20 reps;Theraband                  PT Education - 03/14/20  0751    Education Details Access Code: [email or web address]    Person(s) Educated Patient    Methods Explanation;Demonstration;Handout    Comprehension Verbalized understanding;Returned demonstration            PT Short Term Goals - 03/07/20 1149      PT SHORT TERM GOAL #1   Title pt will be ind with initial HEP    Status Achieved      PT SHORT TERM GOAL #3   Title Pt will be able to run with 50% less pain    Status Achieved             PT Long Term Goals - 03/14/20 0748      PT LONG TERM GOAL #1   Title Pt will be ind with advanced HEP    Time 12    Status On-going      PT LONG TERM GOAL #2   Title Pt will report he is able to return to his normal exercise routine without increased pain    Baseline will go to the gym today    Time 12    Period Weeks  Status On-going      PT LONG TERM GOAL #4   Title Cervical rotation improved to 45 deg without pain bilaterally    Status Achieved                 Plan - 03/14/20 0803    Clinical Impression Statement Pt reports 90% overall improvement in neck pain since the start of care.  PT advanced HEP to include postural strength exercises.  Pt required minor verbal cues for scapular depression.  Pt requested more resistance with theraband and PT issued gray band for HEP.  Pt will continue to benefit from skilled PT to address cervical mobility, postural strength and address pain.    PT Frequency 2x / week    PT Duration 12 weeks    PT Treatment/Interventions ADLs/Self Care Home Management;Biofeedback;Cryotherapy;Electrical Stimulation;Moist Heat;Traction;Neuromuscular re-education;Therapeutic exercise;Therapeutic activities;Patient/family education;Manual techniques;Dry needling;Passive range of motion;Taping    PT Next Visit Plan dry needling next session, review HEP for postural strength    PT Home Exercise Plan Access Code: [email or web address]    Consulted and Agree with Plan of Care Patient           Patient will benefit from  skilled therapeutic intervention in order to improve the following deficits and impairments:     Visit Diagnosis: Cervicalgia  Abnormal posture     Problem List Patient Active Problem List   Diagnosis Date Noted  . Hyperglycemia 04/22/2018  . PCP NOTES >>>>>>>>>>>>>>>>> 10/22/2016  . Annual physical exam 05/05/2013  . HYPERTRIGLYCERIDEMIA 08/05/2009    Robert Soto, PT 03/14/20 8:07 AM  [facility] [address], [place], [place], [address] Phone: [phone]   Fax:  [phone]  Name: Robert Soto MRN: [id number] Date of Birth: [birth year]

## 2020-03-19 ENCOUNTER — Other Ambulatory Visit: Payer: Self-pay

## 2020-03-19 ENCOUNTER — Encounter: Payer: Self-pay | Admitting: Physical Therapy

## 2020-03-19 ENCOUNTER — Ambulatory Visit: Payer: 59 | Admitting: Physical Therapy

## 2020-03-19 DIAGNOSIS — R293 Abnormal posture: Secondary | ICD-10-CM | POA: Diagnosis not present

## 2020-03-19 DIAGNOSIS — M542 Cervicalgia: Secondary | ICD-10-CM

## 2020-03-19 NOTE — Patient Instructions (Signed)
Access Code: [email or web address] URL: [email or web address] Date: 03/19/2020 Prepared by: Jari Favre  Exercises Supine Chin Tuck - 1 x daily - 7 x weekly - 10 reps - 3 sets Thoracic Extension Mobilization with Noodle - 1 x daily - 7 x weekly - 10 reps - 3 sets Seated Diaphragmatic Breathing - 3 x daily - 7 x weekly - 10 reps - 1 sets Doorway Pec Stretch at 90 Degrees Abduction - 2 x daily - 7 x weekly - 1 sets - 3 reps - 20 hold Standing Upper Trapezius Stretch - 3 x daily - 7 x weekly - 1 sets - 10 reps - 20 hold Cervical Rotation Prone on Elbows - 3 x daily - 7 x weekly - 1 sets - 10 reps - 3 hold Quadruped Alternating Arm Lift - 1 x daily - 7 x weekly - 10 reps - 2 sets Seated Diaphragmatic Breathing - 3 x daily - 7 x weekly - 10 reps - 1 sets Supine Shoulder Horizontal Abduction with Resistance - 1 x daily - 7 x weekly - 10 reps - 2 sets Supine Bilateral Shoulder External Rotation with Resistance - 1 x daily - 7 x weekly - 10 reps - 2 sets Supine PNF D2 Flexion with Resistance - 1 x daily - 7 x weekly - 10 reps - 2 sets Thoracic Mobilization on Foam Roll - Hands Clasped - 1 x daily - 7 x weekly - 10 reps - 3 sets Supine Chest Stretch on Foam Roll - 1 x daily - 7 x weekly - 10 reps - 3 sets Neck Mobilization with Foam Roller - 1 x daily - 7 x weekly - 10 reps - 3 sets  Patient Education Trigger Point Dry Needling Tips for Setting Up Your Home Workstation

## 2020-03-19 NOTE — Therapy (Signed)
[facility] [address], [place], [place], [address] Phone: [phone]   Fax:  [phone]  Physical Therapy Treatment  Patient Details  Name: Robert Soto MRN: [id number] Date of Birth: 1965-11-12 Referring Provider (PT): Colon Branch, MD   Encounter Date: 03/19/2020   PT End of Session - 03/19/20 0848    Visit Number 7    Date for PT Re-Evaluation 05/02/20    Authorization Type UHC    Authorization - Visit Number 7    Authorization - Number of Visits 23    PT Start Time 0802    PT Stop Time 0842    PT Time Calculation (min) 40 min    Activity Tolerance Patient tolerated treatment well    Behavior During Therapy [facility] for tasks assessed/performed           Past Medical History:  Diagnosis Date  . Allergy   . High triglycerides    hx. up to 900s- was treated with vytorin (d/c when he was training for marathon & TG decreased)  . Hyperglycemia 04/22/2018    Past Surgical History:  Procedure Laterality Date  . TONSILLECTOMY  1976  . VASECTOMY  2012    There were no vitals filed for this visit.   Subjective Assessment - 03/19/20 0918    Subjective Pt denies pain today.  States he is still feeling good.  "I was lazy this week"    Patient Stated Goals get back to running    Currently in Pain? No/denies                             [facility] Adult PT Treatment/Exercise - 03/19/20 0001      Self-Care   Other Self-Care Comments  educated on soft foam roller to do exercises at home      Neck Exercises: Supine   Other Supine Exercise supine foam roller cervical mobs and thoracic mobs - 10x each way    Other Supine Exercise foam roll along spine deep breathing cues for ribcage movement and UE alternating stabilize with core            Trigger Point Dry Needling - 03/19/20 0001    Consent Given? Yes    Education Handout Provided Previously provided    Muscles Treated Head and Neck Upper  trapezius;Oblique capitus;Suboccipitals;Cervical multifidi    Upper Trapezius Response Twitch reponse elicited;Palpable increased muscle length    Oblique Capitus Response Twitch response elicited;Palpable increased muscle length    Suboccipitals Response Twitch response elicited;Palpable increased muscle length    Cervical multifidi Response Twitch reponse elicited;Palpable increased muscle length                PT Education - 03/19/20 0847    Education Details Access Code: [email or web address]    Person(s) Educated Patient    Methods Explanation;Demonstration;Verbal cues;Handout    Comprehension Verbalized understanding;Returned demonstration            PT Short Term Goals - 03/07/20 1149      PT SHORT TERM GOAL #1   Title pt will be ind with initial HEP    Status Achieved      PT SHORT TERM GOAL #3   Title Pt will be able to run with 50% less pain    Status Achieved             PT Long Term Goals - 03/14/20 4656  PT LONG TERM GOAL #1   Title Pt will be ind with advanced HEP    Time 12    Status On-going      PT LONG TERM GOAL #2   Title Pt will report he is able to return to his normal exercise routine without increased pain    Baseline will go to the gym today    Time 12    Period Weeks    Status On-going      PT LONG TERM GOAL #4   Title Cervical rotation improved to 45 deg without pain bilaterally    Status Achieved                 Plan - 03/19/20 0831    Clinical Impression Statement Pt was still feeling good from previous session and doing well with HEP.  Pt was educated on exercises with low density foam roller and did well feeling more relaxed after treatment today.  Pt needed cues when doing neck stretches for posture in sitting and reported less pain. Pt will benefit from skilled PT to continue with improved thoracic and ribcage mobility along with core stability for improved posture    PT Treatment/Interventions ADLs/Self Care Home  Management;Biofeedback;Cryotherapy;Electrical Stimulation;Moist Heat;Traction;Neuromuscular re-education;Therapeutic exercise;Therapeutic activities;Patient/family education;Manual techniques;Dry needling;Passive range of motion;Taping    PT Next Visit Plan add soft foam roller with band; thoracic and ribcage breathing and core strength    PT Home Exercise Plan Access Code: [email or web address]    Consulted and Agree with Plan of Care Patient           Patient will benefit from skilled therapeutic intervention in order to improve the following deficits and impairments:  Pain, Hypomobility, Postural dysfunction  Visit Diagnosis: Cervicalgia  Abnormal posture     Problem List Patient Active Problem List   Diagnosis Date Noted  . Hyperglycemia 04/22/2018  . PCP NOTES >>>>>>>>>>>>>>>>> 10/22/2016  . Annual physical exam 05/05/2013  . HYPERTRIGLYCERIDEMIA 08/05/2009    Jule Ser, PT 03/19/2020, 9:21 AM  [facility] [address] [place], [place], [address] Phone: [phone]   Fax:  [phone]  Name: Robert Soto MRN: [id number] Date of Birth: 07-16-1966

## 2020-03-21 ENCOUNTER — Ambulatory Visit: Payer: 59

## 2020-04-02 ENCOUNTER — Other Ambulatory Visit: Payer: Self-pay

## 2020-04-02 ENCOUNTER — Ambulatory Visit: Payer: 59

## 2020-04-02 DIAGNOSIS — R293 Abnormal posture: Secondary | ICD-10-CM | POA: Diagnosis not present

## 2020-04-02 DIAGNOSIS — M542 Cervicalgia: Secondary | ICD-10-CM

## 2020-04-02 NOTE — Therapy (Signed)
[facility] [address], [place], [place], [address] Phone: [phone]   Fax:  [phone]  Physical Therapy Treatment  Patient Details  Name: Robert Soto MRN: [id number] Date of Birth: 05-21-1966 Referring Provider (PT): Colon Branch, MD   Encounter Date: 04/02/2020   PT End of Session - 04/02/20 0816    Visit Number 8    Authorization - Visit Number 8    Authorization - Number of Visits 23    PT Start Time 0730    PT Stop Time 0802    PT Time Calculation (min) 32 min    Activity Tolerance Patient tolerated treatment well    Behavior During Therapy [facility] for tasks assessed/performed           Past Medical History:  Diagnosis Date   Allergy    High triglycerides    hx. up to 900s- was treated with vytorin (d/c when he was training for marathon & TG decreased)   Hyperglycemia 04/22/2018    Past Surgical History:  Procedure Laterality Date   TONSILLECTOMY  1976   VASECTOMY  2012    There were no vitals filed for this visit.   Subjective Assessment - 04/02/20 0732    Subjective 80% better.  I'm ready for D/C.    Patient Stated Goals get back to running    Currently in Pain? No/denies              [facility] PT Assessment - 04/02/20 0001      Assessment   Medical Diagnosis M54.2 (ICD-10-CM) - Neck pain    Referring Provider (PT) Colon Branch, MD      Precautions   Precautions None      Prior Function   Level of Independence Independent      Cognition   Overall Cognitive Status Within Functional Limits for tasks assessed      Observation/Other Assessments   Focus on Therapeutic Outcomes (FOTO)  4%       AROM   Cervical - Right Side Bend 35    Cervical - Left Side Bend 35    Cervical - Right Rotation 55    Cervical - Left Rotation 55                         [facility] Adult PT Treatment/Exercise - 04/02/20 0001      Manual Therapy   Manual Therapy Soft tissue  mobilization    Manual therapy comments palpation and assessment and tissue mobization with dry needling    Soft tissue mobilization elongation to bil upper traps, cervical paraspinals and suboccipitals            Trigger Point Dry Needling - 04/02/20 0001    Consent Given? Yes    Muscles Treated Head and Neck Upper trapezius;Oblique capitus;Suboccipitals;Cervical multifidi    Upper Trapezius Response Twitch reponse elicited;Palpable increased muscle length    Oblique Capitus Response Twitch response elicited;Palpable increased muscle length    Suboccipitals Response Twitch response elicited;Palpable increased muscle length    Cervical multifidi Response Twitch reponse elicited;Palpable increased muscle length                  PT Short Term Goals - 03/07/20 1149      PT SHORT TERM GOAL #1   Title pt will be ind with initial HEP    Status Achieved      PT SHORT TERM GOAL #3  Title Pt will be able to run with 50% less pain    Status Achieved             PT Long Term Goals - 04/02/20 0733      PT LONG TERM GOAL #1   Title Pt will be ind with advanced HEP    Status Achieved      PT LONG TERM GOAL #2   Title Pt will report he is able to return to his normal exercise routine without increased pain    Status Achieved      PT LONG TERM GOAL #3   Title FOTO 29% or less limited    Baseline 4% limitation    Status Achieved      PT LONG TERM GOAL #4   Title Cervical rotation improved to 45 deg without pain bilaterally    Baseline 55    Status Achieved                 Plan - 04/02/20 0816    Clinical Impression Statement Pt reports 80% overall improvement since the start of care.  Pt with improved cervical A/ROM with stiffness at end range.  Pt has met all goals.  Pt with tension and trigger points in bil neck and upper traps and demonstrated improved tissue mobility after manual therapy and dry needling.  Pt will be discharged to HEP today.    PT Next  Visit Plan D/C PT to HEP today    PT Home Exercise Plan Access Code: [email or web address]    Consulted and Agree with Plan of Care Patient           Patient will benefit from skilled therapeutic intervention in order to improve the following deficits and impairments:     Visit Diagnosis: Abnormal posture  Cervicalgia     Problem List Patient Active Problem List   Diagnosis Date Noted   Hyperglycemia 04/22/2018   PCP NOTES >>>>>>>>>>>>>>>>> 10/22/2016   Annual physical exam 05/05/2013   HYPERTRIGLYCERIDEMIA 08/05/2009    PHYSICAL THERAPY DISCHARGE SUMMARY  Visits from Start of Care: 8  Current functional level related to goals / functional outcomes: See above.  Pt has met all goals.     Remaining deficits: Pt with reduced cervical A/ROM and stiffness at end range.  This is improved since evaluation.  PT encouraged pt to continue with HEP.     Education / Equipment: HEP, posture Plan: Patient agrees to discharge.  Patient goals were met. Patient is being discharged due to meeting the stated rehab goals.  ?????        Robert Soto, PT 04/02/20 8:18 AM  [facility] [address] [place], [place], [address] Phone: [phone]   Fax:  [phone]  Name: Robert Soto MRN: [id number] Date of Birth: Feb 23, 1966

## 2020-07-23 ENCOUNTER — Other Ambulatory Visit: Payer: Self-pay | Admitting: Internal Medicine

## 2020-08-27 ENCOUNTER — Other Ambulatory Visit: Payer: Self-pay

## 2020-08-27 ENCOUNTER — Encounter: Payer: Self-pay | Admitting: Internal Medicine

## 2020-08-27 ENCOUNTER — Ambulatory Visit (INDEPENDENT_AMBULATORY_CARE_PROVIDER_SITE_OTHER): Payer: 59 | Admitting: Internal Medicine

## 2020-08-27 VITALS — BP 121/85 | HR 61 | Temp 97.8°F | Resp 16 | Ht 69.0 in | Wt 218.2 lb

## 2020-08-27 DIAGNOSIS — Z79899 Other long term (current) drug therapy: Secondary | ICD-10-CM

## 2020-08-27 DIAGNOSIS — E781 Pure hyperglyceridemia: Secondary | ICD-10-CM | POA: Diagnosis not present

## 2020-08-27 DIAGNOSIS — F419 Anxiety disorder, unspecified: Secondary | ICD-10-CM

## 2020-08-27 DIAGNOSIS — Z0000 Encounter for general adult medical examination without abnormal findings: Secondary | ICD-10-CM | POA: Diagnosis not present

## 2020-08-27 DIAGNOSIS — R739 Hyperglycemia, unspecified: Secondary | ICD-10-CM

## 2020-08-27 DIAGNOSIS — Z1159 Encounter for screening for other viral diseases: Secondary | ICD-10-CM

## 2020-08-27 DIAGNOSIS — Z23 Encounter for immunization: Secondary | ICD-10-CM

## 2020-08-27 MED ORDER — ATORVASTATIN CALCIUM 40 MG PO TABS
40.0000 mg | ORAL_TABLET | Freq: Every day | ORAL | 3 refills | Status: DC
Start: 2020-08-27 — End: 2021-08-07

## 2020-08-27 MED ORDER — FENOFIBRATE 160 MG PO TABS
160.0000 mg | ORAL_TABLET | Freq: Every day | ORAL | 3 refills | Status: DC
Start: 2020-08-27 — End: 2021-08-07

## 2020-08-27 MED ORDER — ALPRAZOLAM 0.5 MG PO TABS
0.5000 mg | ORAL_TABLET | Freq: Two times a day (BID) | ORAL | 0 refills | Status: DC | PRN
Start: 2020-08-27 — End: 2021-09-01

## 2020-08-27 NOTE — Patient Instructions (Addendum)
Happy early Rudene Anda!   Get the blood work at our [employer] [facility] office, fasting.  They open at 7:30 AM and closed by 5 PM.   GO TO THE FRONT DESK, PLEASE SCHEDULE YOUR APPOINTMENTS Come back for a physical exam in 1 year

## 2020-08-27 NOTE — Progress Notes (Signed)
Subjective:    Patient ID: Robert Soto, male    DOB: May 03, 1966, 54 y.o.   MRN: [id number]  DOS:  08/27/2020 Type of visit - description: cpx  At the last office visit he complained of neck pain. Did some physical therapy and now the pain is greatly reduced.  Almost resolved. Not taking meloxicam.  Wt Readings from Last 3 Encounters:  08/27/20 218 lb 4 oz (99 kg)  01/18/20 209 lb 8 oz (95 kg)  10/26/19 214 lb (97.1 kg)     Review of Systems Doing well. Occasionally see drops of red blood after a BM in the toilet paper.  That is infrequent. No pain, itching or burning at the anus when that happened.  Other than above, a 14 point review of systems is negative      Past Medical History:  Diagnosis Date  . Allergy   . High triglycerides    hx. up to 900s- was treated with vytorin (d/c when he was training for marathon & TG decreased)  . Hyperglycemia 04/22/2018    Past Surgical History:  Procedure Laterality Date  . TONSILLECTOMY  1976  . VASECTOMY  2012    Allergies as of 08/27/2020   No Known Allergies     Medication List       Accurate as of August 27, 2020 11:59 PM. If you have any questions, ask your nurse or doctor.        STOP taking these medications   meloxicam 7.5 MG tablet Commonly known as: MOBIC Stopped by: [name], MD     TAKE these medications   ALPRAZolam 0.5 MG tablet Commonly known as: Xanax Take 1 tablet (0.5 mg total) by mouth 2 (two) times daily as needed for anxiety.   aspirin 81 MG tablet Take 81 mg by mouth daily.   atorvastatin 40 MG tablet Commonly known as: LIPITOR Take 1 tablet (40 mg total) by mouth daily.   cetirizine 10 MG tablet Commonly known as: ZYRTEC Take 10 mg by mouth daily. In the cold weather   fenofibrate 160 MG tablet Take 1 tablet (160 mg total) by mouth daily.   fish oil-omega-3 fatty acids 1000 MG capsule Take 2 g by mouth daily.   POTASSIUM PO Take by mouth. 3 days a week when running     VITAMIN B 12 PO Take by mouth. When he runs 3 days a week   vitamin C 1000 MG tablet Take 1,000 mg by mouth daily.          Objective:   Physical Exam BP 121/85 (BP Location: Right Arm, Patient Position: Sitting, Cuff Size: Normal)   Pulse 61   Temp 97.8 F (36.6 C) (Oral)   Resp 16   Ht 5\' 9"  (1.753 m)   Wt 218 lb 4 oz (99 kg)   SpO2 99%   BMI 32.23 kg/m  General: Well developed, NAD, BMI noted Neck: No  thyromegaly  HEENT:  Normocephalic . Face symmetric, atraumatic Lungs:  CTA B Normal respiratory effort, no intercostal retractions, no accessory muscle use. Heart: RRR,  no murmur.  Abdomen:  Not distended, soft, non-tender. No rebound or rigidity.   Lower extremities: no pretibial edema bilaterally  Skin: Exposed areas without rash. Not pale. Not jaundice Neurologic:  alert & oriented X3.  Speech normal, gait appropriate for age and unassisted Strength symmetric and appropriate for age.  Psych: Cognition and judgment appear intact.  Cooperative with normal attention span and concentration.  Behavior appropriate.  No anxious or depressed appearing.     Assessment      Assessment Hyperglycemia Dyslipidemia: Triglycerides up to the 1156, usually responding very well to exercise more than diet + FH CAD, father age 25, 2011- (-) GTX , Cor Ca+ score: zero 08-2018 Internal hemorrhoids  Plan: Here for CPX Hyperglycemia: Check A1c Dyslipidemia: On statins and fenofibrate, checking labs FH CAD: Calcium coronary score 0 in 2019, on aspirin and  dyslipidemia treatment. Internal hemorrhoids: Occasional bleeding, last colonoscopy 10-2019, polyps removed, internal hemorrhoid noted. Observation for now. Neck pain: See last visit, definitely improved, not completely gone, he will let me know if he is interested in further eval. Episodic anxiety, on Xanax, UDS today RTC 1 year      This visit occurred during the SARS-CoV-2 public health emergency.  Safety protocols  were in place, including screening questions prior to the visit, additional usage of staff PPE, and extensive cleaning of exam room while observing appropriate contact time as indicated for disinfecting solutions.

## 2020-08-27 NOTE — Progress Notes (Signed)
Pre visit review using our clinic review tool, if applicable. No additional management support is needed unless otherwise documented below in the visit note. 

## 2020-08-31 ENCOUNTER — Encounter: Payer: Self-pay | Admitting: Internal Medicine

## 2020-08-31 NOTE — Assessment & Plan Note (Signed)
  Td 2011: Reassess next year Shingrix; discussed before. COVID vaccine: X2, encouraged to get a booster which he plans to do Flu shot today CCS: Colonoscopy 10-2019, next per GI  Prostate ca screening: DRE, PSA normal 2020 Lifestyle: He is very active on and off throughout the year depending on his work schedule.  Plans to run a spring soon. + FH CAD, on ASA , asx Labs: CMP, FLP, CBC, A1c, TSH, hep C, UDS

## 2020-08-31 NOTE — Assessment & Plan Note (Addendum)
Here for CPX Hyperglycemia: Check A1c Dyslipidemia: On statins and fenofibrate, checking labs FH CAD: Calcium coronary score 0 in 2019, on aspirin and  dyslipidemia treatment. Internal hemorrhoids: Occasional bleeding, last colonoscopy 10-2019, polyps removed, internal hemorrhoid noted. Observation for now. Neck pain: See last visit, definitely improved, not completely gone, he will let me know if he is interested in further eval. Episodic anxiety, on Xanax, UDS today RTC 1 year

## 2020-12-26 ENCOUNTER — Encounter: Payer: Self-pay | Admitting: Internal Medicine

## 2021-05-12 ENCOUNTER — Encounter: Payer: Self-pay | Admitting: Internal Medicine

## 2021-05-12 ENCOUNTER — Telehealth (INDEPENDENT_AMBULATORY_CARE_PROVIDER_SITE_OTHER): Payer: BC Managed Care – PPO | Admitting: Internal Medicine

## 2021-05-12 VITALS — Ht 69.0 in | Wt 217.0 lb

## 2021-05-12 DIAGNOSIS — U071 COVID-19: Secondary | ICD-10-CM | POA: Diagnosis not present

## 2021-05-12 MED ORDER — AZITHROMYCIN 250 MG PO TABS: ORAL_TABLET | ORAL | 0 refills | Status: DC

## 2021-05-12 MED ORDER — HYDROCODONE BIT-HOMATROP MBR 5-1.5 MG PO TABS: 1.0000 | ORAL_TABLET | Freq: Two times a day (BID) | ORAL | 0 refills | Status: DC | PRN

## 2021-05-12 MED ORDER — MOLNUPIRAVIR EUA 200MG CAPSULE: 4.0000 | ORAL_CAPSULE | Freq: Two times a day (BID) | ORAL | 0 refills | Status: AC

## 2021-05-12 NOTE — Progress Notes (Signed)
Subjective:    Patient ID: Robert Soto, male    DOB: [birth year], 55 y.o.   MRN: [id number]  DOS:  05/12/2021 Type of visit - description: Virtual Visit via Video Note  I connected with the above patient  by a video enabled telemedicine application and verified that I am speaking with the correct person using two identifiers.   THIS ENCOUNTER IS A VIRTUAL VISIT DUE TO COVID-19 - PATIENT WAS NOT SEEN IN THE OFFICE. PATIENT HAS CONSENTED TO VIRTUAL VISIT / TELEMEDICINE VISIT   Location of patient: home  Location of provider: office  Persons participating in the virtual visit: patient, provider   I discussed the limitations of evaluation and management by telemedicine and the availability of in person appointments. The patient expressed understanding and agreed to proceed.  Acute   Symptoms a started 05/09/2021 with mild chills. The next day he tested positive for COVID and developed mild symptoms including chills, cough, greenish sputum with some streaks of blood. Malaise, headaches. Yesterday, symptoms definitely got worse. Malaise increased, ++ myalgias. Temperature between 101 -102 degrees, decreases with ibuprofen.  No chest pain, no difficulty breathing. No nausea, vomiting, diarrhea    Review of Systems See above   Past Medical History:  Diagnosis Date   Allergy    High triglycerides    hx. up to 900s- was treated with vytorin (d/c when he was training for marathon & TG decreased)   Hyperglycemia 04/22/2018    Past Surgical History:  Procedure Laterality Date   TONSILLECTOMY  1976   VASECTOMY  2012    Allergies as of 05/12/2021   No Known Allergies      Medication List        Accurate as of May 12, 2021 11:20 AM. If you have any questions, ask your nurse or doctor.          ALPRAZolam 0.5 MG tablet Commonly known as: Xanax Take 1 tablet (0.5 mg total) by mouth 2 (two) times daily as needed for anxiety.   aspirin 81 MG tablet Take 81 mg by mouth  daily.   atorvastatin 40 MG tablet Commonly known as: LIPITOR Take 1 tablet (40 mg total) by mouth daily.   cetirizine 10 MG tablet Commonly known as: ZYRTEC Take 10 mg by mouth daily. In the cold weather   fenofibrate 160 MG tablet Take 1 tablet (160 mg total) by mouth daily.   fish oil-omega-3 fatty acids 1000 MG capsule Take 2 g by mouth daily.   POTASSIUM PO Take by mouth. 3 days a week when running   VITAMIN B 12 PO Take by mouth. When he runs 3 days a week   vitamin C 1000 MG tablet Take 1,000 mg by mouth daily.           Objective:   Physical Exam Ht '5\' 9"'$  (1.753 m)   Wt 217 lb (98.4 kg)   BMI 32.05 kg/m  This is a virtual video visit, alert oriented x3, does not look toxic, no respiratory distress, speaking complete sentences, I did notice some cough and chest congestion. O2 sat 97%, recheck 100%    Assessment       Assessment Hyperglycemia Dyslipidemia: Triglycerides up to the 1156, usually responding very well to exercise more than diet + FH CAD, father age 34, 2011- (-) GTX , Cor Ca+ score: zero 08-2018 Internal hemorrhoids  PLAN COVID-19. Patient is 55 years old, had a total of 3 COVID vaccines. Developed symptoms 3 days ago. Worse  symptoms were last night. O2 sat today, 97% when we start talking and 100% later.  Yesterday at some point was 93% briefly. I am somewhat concerned about the hemoptysis, bacterial component of infection?Marland Kitchen No recent renal function. Plan: Molnupiravir Zithromax Mucinex DM, ibuprofen or Tylenol, hydrocodone for cough control. Red flag symptoms discussed with the patient including increase hemoptysis, chest pain, difficulty breathing, O2 sat less than 94%. He verbalized understanding. Rec f/u 3 weeks in person (will need labs ordered at time of last CPX)    I discussed the assessment and treatment plan with the patient. The patient was provided an opportunity to ask questions and all were answered. The patient agreed  with the plan and demonstrated an understanding of the instructions.   The patient was advised to call back or seek an in-person evaluation if the symptoms worsen or if the condition fails to improve as anticipated.

## 2021-05-12 NOTE — Assessment & Plan Note (Signed)
COVID-19. Patient is 55 years old, had a total of 3 COVID vaccines. Developed symptoms 3 days ago. Worse symptoms were last night. O2 sat today, 97% when we start talking and 100% later.  Yesterday at some point was 93% briefly. I am somewhat concerned about the hemoptysis, bacterial component of infection?Marland Kitchen No recent renal function. Plan: Molnupiravir Zithromax Mucinex DM, ibuprofen or Tylenol, hydrocodone for cough control. Red flag symptoms discussed with the patient including increase hemoptysis, chest pain, difficulty breathing, O2 sat less than 94%. He verbalized understanding. Rec f/u 3 weeks in person (will need labs ordered at time of last CPX)

## 2021-05-20 ENCOUNTER — Ambulatory Visit: Payer: BC Managed Care – PPO | Admitting: Internal Medicine

## 2021-05-20 ENCOUNTER — Other Ambulatory Visit: Payer: Self-pay

## 2021-05-20 ENCOUNTER — Ambulatory Visit (HOSPITAL_BASED_OUTPATIENT_CLINIC_OR_DEPARTMENT_OTHER)
Admission: RE | Admit: 2021-05-20 | Discharge: 2021-05-20 | Disposition: A | Discharge status: Home / Self Care | Payer: BC Managed Care – PPO | Source: Ambulatory Visit | Attending: Internal Medicine | Admitting: Internal Medicine

## 2021-05-20 ENCOUNTER — Encounter: Payer: Self-pay | Admitting: Internal Medicine

## 2021-05-20 VITALS — BP 114/78 | HR 70 | Temp 98.3°F | Resp 18 | Ht 69.0 in | Wt 219.0 lb

## 2021-05-20 DIAGNOSIS — U071 COVID-19: Secondary | ICD-10-CM

## 2021-05-20 DIAGNOSIS — R739 Hyperglycemia, unspecified: Secondary | ICD-10-CM | POA: Diagnosis not present

## 2021-05-20 DIAGNOSIS — E781 Pure hyperglyceridemia: Secondary | ICD-10-CM

## 2021-05-20 DIAGNOSIS — B999 Unspecified infectious disease: Secondary | ICD-10-CM | POA: Diagnosis not present

## 2021-05-20 LAB — LIPID PANEL
Cholesterol: 221 mg/dL — ABNORMAL HIGH (ref 0–200)
HDL: 37.5 mg/dL — ABNORMAL LOW (ref >39.00)
NonHDL: 183.63
Total CHOL/HDL Ratio: 6
Triglycerides: 391 mg/dL — ABNORMAL HIGH (ref 0.0–149.0)
VLDL: 78.2 mg/dL — ABNORMAL HIGH (ref 0.0–40.0)

## 2021-05-20 LAB — CBC WITH DIFFERENTIAL/PLATELET
Basophils Absolute: 0 10*3/uL (ref 0.0–0.1)
Basophils Relative: 0.6 % (ref 0.0–3.0)
Eosinophils Absolute: 0.2 10*3/uL (ref 0.0–0.7)
Eosinophils Relative: 2.6 % (ref 0.0–5.0)
HCT: 43.7 % (ref 39.0–52.0)
Hemoglobin: 14.9 g/dL (ref 13.0–17.0)
Lymphocytes Relative: 25.7 % (ref 12.0–46.0)
Lymphs Abs: 2 10*3/uL (ref 0.7–4.0)
MCHC: 34 g/dL (ref 30.0–36.0)
MCV: 87.4 fl (ref 78.0–100.0)
Monocytes Absolute: 0.8 10*3/uL (ref 0.1–1.0)
Monocytes Relative: 10.5 % (ref 3.0–12.0)
Neutro Abs: 4.7 10*3/uL (ref 1.4–7.7)
Neutrophils Relative %: 60.6 % (ref 43.0–77.0)
Platelets: 318 10*3/uL (ref 150.0–400.0)
RBC: 5.01 Mil/uL (ref 4.22–5.81)
RDW: 12.8 % (ref 11.5–15.5)
WBC: 7.7 10*3/uL (ref 4.0–10.5)

## 2021-05-20 LAB — COMPREHENSIVE METABOLIC PANEL
ALT: 45 U/L (ref 0–53)
AST: 25 U/L (ref 0–37)
Albumin: 4.5 g/dL (ref 3.5–5.2)
Alkaline Phosphatase: 84 U/L (ref 39–117)
BUN: 18 mg/dL (ref 6–23)
CO2: 27 mEq/L (ref 19–32)
Calcium: 10.1 mg/dL (ref 8.4–10.5)
Chloride: 103 mEq/L (ref 96–112)
Creatinine, Ser: 1.05 mg/dL (ref 0.40–1.50)
GFR: 81.5 mL/min (ref >60.00)
Glucose, Bld: 93 mg/dL (ref 70–99)
Potassium: 4.5 mEq/L (ref 3.5–5.1)
Sodium: 138 mEq/L (ref 135–145)
Total Bilirubin: 0.5 mg/dL (ref 0.2–1.2)
Total Protein: 7.6 g/dL (ref 6.0–8.3)

## 2021-05-20 LAB — HEMOGLOBIN A1C: Hgb A1c MFr Bld: 6.4 % (ref 4.6–6.5)

## 2021-05-20 LAB — LDL CHOLESTEROL, DIRECT: Direct LDL: 122 mg/dL

## 2021-05-20 NOTE — Patient Instructions (Signed)
  GO TO THE LAB : Get the blood work     [place], [place] Come back for   a physical exam by November 2022  STOP BY THE FIRST FLOOR:  get the XR

## 2021-05-20 NOTE — Progress Notes (Signed)
Subjective:    Patient ID: Robert Soto, male    DOB: 1965/12/27, 55 y.o.   MRN: [id number]  DOS:  05/20/2021 Type of visit - description: Follow-up See last visit, was diagnosed with COVID-19. He was prescribed molnupiravir and a zpack (antibiotics due to reported hemoptysis, at the time he did not have any calf swelling or leg pain).   He initially felt quite poorly, but gradually is feeling better. When  he was at the worst of his symptoms he rates his status  as a "2/10", now is a 7/10. Still feeling fatigue and has some backache.  Still DOE when going upstairs. Still coughing. Had hemoptysis, that is largely resolved. Has occasionally white or greenish sputum.  Review of Systems No further fever No nausea vomiting or diarrhea  Past Medical History:  Diagnosis Date   Allergy    High triglycerides    hx. up to 900s- was treated with vytorin (d/c when he was training for marathon & TG decreased)   Hyperglycemia 04/22/2018    Past Surgical History:  Procedure Laterality Date   TONSILLECTOMY  1976   VASECTOMY  2012    Allergies as of 05/20/2021   No Known Allergies      Medication List        Accurate as of May 20, 2021  8:41 PM. If you have any questions, ask your nurse or doctor.          STOP taking these medications    azithromycin 250 MG tablet Commonly known as: ZITHROMAX Stopped by: [name], MD   POTASSIUM PO Stopped by: [name], MD   VITAMIN B 12 PO Stopped by: [name], MD       TAKE these medications    ALPRAZolam 0.5 MG tablet Commonly known as: Xanax Take 1 tablet (0.5 mg total) by mouth 2 (two) times daily as needed for anxiety.   aspirin 81 MG tablet Take 81 mg by mouth daily.   atorvastatin 40 MG tablet Commonly known as: LIPITOR Take 1 tablet (40 mg total) by mouth daily.   cetirizine 10 MG tablet Commonly known as: ZYRTEC Take 10 mg by mouth daily. In the cold weather   fenofibrate 160 MG tablet Take 1 tablet (160  mg total) by mouth daily.   fish oil-omega-3 fatty acids 1000 MG capsule Take 2 g by mouth daily.   HYDROcodone Bit-Homatrop MBr 5-1.5 MG Tabs Take 1 tablet by mouth 2 (two) times daily as needed (severe cough).   vitamin C 1000 MG tablet Take 1,000 mg by mouth daily.           Objective:   Physical Exam BP 114/78 (BP Location: Left Arm, Patient Position: Sitting, Cuff Size: Normal)   Pulse 70   Temp 98.3 F (36.8 C) (Oral)   Resp 18   Ht '5\' 9"'$  (1.753 m)   Wt 219 lb (99.3 kg)   SpO2 98%   BMI 32.34 kg/m  General:   Well developed, NAD, BMI noted. HEENT:  Normocephalic . Face symmetric, atraumatic Lungs:  CTA B Normal respiratory effort, no intercostal retractions, no accessory muscle use. Heart: RRR,  no murmur.  Lower extremities: no pretibial edema bilaterally.  Calves symmetric Skin: Not pale. Not jaundice Neurologic:  alert & oriented X3.  Speech normal, gait appropriate for age and unassisted Psych--  Cognition and judgment appear intact.  Cooperative with normal attention span and concentration.  Behavior appropriate. No anxious or depressed appearing.  Assessment    Assessment Hyperglycemia Dyslipidemia: Triglycerides up to the 1156, usually responding very well to exercise more than diet + FH CAD, father age 72, 2011- (-) GTX , Cor Ca+ score: zero 08-2018 Internal hemorrhoids  PLAN COVID-19: Sxs started 05-09-21, started molnupiravir and a zpack 05-12-21, gradually getting better. Cough decreased, had hemoptysis at first, that quickly resolved.  He still have some  clear or light greenish sputum. Still some DOE going up stairs. Yesterday, he did an additional COVID test and it is still +, I do not think that represent active infection.  Okay to go back to the community wearing the mask for few days. Plan: Chest x-ray, CBC. Encourage to call if he is not 100% recuperated in the next 2 to 3 weeks. Hyperglycemia: Check A1c Dyslipidemia: Checking  labs RTC CPX 08-2021.    This visit occurred during the SARS-CoV-2 public health emergency.  Safety protocols were in place, including screening questions prior to the visit, additional usage of staff PPE, and extensive cleaning of exam room while observing appropriate contact time as indicated for disinfecting solutions.

## 2021-05-20 NOTE — Assessment & Plan Note (Signed)
COVID-19: Sxs started 05-09-21, started molnupiravir and a zpack 05-12-21, gradually getting better. Cough decreased, had hemoptysis at first, that quickly resolved.  He still have some  clear or light greenish sputum. Still some DOE going up stairs. Yesterday, he did an additional COVID test and it is still +, I do not think that represent active infection.  Okay to go back to the community wearing the mask for few days. Plan: Chest x-ray, CBC. Encourage to call if he is not 100% recuperated in the next 2 to 3 weeks. Hyperglycemia: Check A1c Dyslipidemia: Checking labs RTC CPX 08-2021.

## 2021-08-06 ENCOUNTER — Other Ambulatory Visit: Payer: Self-pay | Admitting: Internal Medicine

## 2021-09-01 ENCOUNTER — Encounter: Payer: Self-pay | Admitting: Internal Medicine

## 2021-09-01 ENCOUNTER — Other Ambulatory Visit: Payer: Self-pay

## 2021-09-01 ENCOUNTER — Ambulatory Visit (INDEPENDENT_AMBULATORY_CARE_PROVIDER_SITE_OTHER): Payer: BC Managed Care – PPO | Admitting: Internal Medicine

## 2021-09-01 VITALS — BP 116/74 | HR 91 | Temp 98.4°F | Resp 16 | Ht 69.0 in | Wt 225.4 lb

## 2021-09-01 DIAGNOSIS — Z8249 Family history of ischemic heart disease and other diseases of the circulatory system: Secondary | ICD-10-CM

## 2021-09-01 DIAGNOSIS — E781 Pure hyperglyceridemia: Secondary | ICD-10-CM

## 2021-09-01 DIAGNOSIS — R739 Hyperglycemia, unspecified: Secondary | ICD-10-CM

## 2021-09-01 DIAGNOSIS — Z23 Encounter for immunization: Secondary | ICD-10-CM

## 2021-09-01 DIAGNOSIS — Z0000 Encounter for general adult medical examination without abnormal findings: Secondary | ICD-10-CM

## 2021-09-01 DIAGNOSIS — Z1159 Encounter for screening for other viral diseases: Secondary | ICD-10-CM

## 2021-09-01 MED ORDER — ALPRAZOLAM 0.5 MG PO TABS
0.5000 mg | ORAL_TABLET | Freq: Two times a day (BID) | ORAL | 0 refills | Status: DC | PRN
Start: 2021-09-01 — End: 2022-09-15

## 2021-09-01 NOTE — Progress Notes (Signed)
Subjective:    Patient ID: Robert Soto, male    DOB: 1966/03/01, 55 y.o.   MRN: [id number]  DOS:  09/01/2021 Type of visit - description: Here for CPX  Here for CPX. Feels well. No symptoms. Good med compliance. Admits that he is not eating healthy or exercising  Review of Systems   A 14 point review of systems is negative    Past Medical History:  Diagnosis Date   Allergy    High triglycerides    hx. 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[employer]   Tobacco Use   Smoking status: Former   Smokeless tobacco: Never   Tobacco comments:    early 20's smoked at parties  Substance and Sexual Activity   Alcohol use: Yes    Comment: socially    Drug use: No   Sexual activity: Not on file  Other Topics Concern   Not on file  Social History Narrative   Original from [place]   Social Determinants of Health   Financial Resource Strain: Not on file  Food Insecurity: Not on file  Transportation Needs: Not on file  Physical Activity: Not on file  Stress: Not on file  Social Connections: Not on file  Intimate Partner Violence: Not on file    Allergies as of 09/01/2021   No Known Allergies      Medication List        Accurate as of September 01, 2021 11:59 PM. If you have any questions, ask your nurse or doctor.          STOP taking these medications    HYDROcodone Bit-Homatrop MBr 5-1.5 MG Tabs Stopped by: [name], MD       TAKE these medications    ALPRAZolam 0.5 MG tablet Commonly known as: Xanax Take 1 tablet (0.5 mg total) by  mouth 2 (two) times daily as needed for anxiety.   aspirin 81 MG tablet Take 81 mg by mouth daily.   atorvastatin 40 MG tablet Commonly known as: LIPITOR Take 1 tablet (40 mg total) by mouth daily.   cetirizine 10 MG tablet Commonly known as: ZYRTEC Take 10 mg by mouth daily. In the cold weather   fenofibrate 160 MG tablet Take 1 tablet (160 mg total) by mouth daily.   fish oil-omega-3 fatty acids 1000 MG capsule Take 2 g by mouth daily.   vitamin C 1000 MG tablet Take 1,000 mg by mouth daily.           Objective:   Physical Exam BP 116/74 (BP Location: Left Arm, Patient Position: Sitting, Cuff Size: Normal)   Pulse 91   Temp 98.4 F (36.9 C) (Oral)   Resp 16   Ht 5\' 9"  (1.753 m)   Wt 225 lb 6 oz (102.2 kg)   SpO2 96%   BMI 33.28 kg/m  General: Well developed, NAD, BMI noted Neck: No  thyromegaly  HEENT:  Normocephalic . Face symmetric, atraumatic Lungs:  CTA B Normal respiratory effort, no intercostal retractions,  no accessory muscle use. Heart: RRR,  no murmur.  Abdomen:  Not distended, soft, non-tender. No rebound or rigidity.   Lower extremities: no pretibial edema bilaterally DRE: Declined Skin: Exposed areas without rash. Not pale. Not jaundice Neurologic:  alert & oriented X3.  Speech normal, gait appropriate for age and unassisted Strength symmetric and appropriate for age.  Psych: Cognition and judgment appear intact.  Cooperative with normal attention span and concentration.  Behavior appropriate. No anxious or depressed appearing.     Assessment      ASSESSMENT Hyperglycemia Dyslipidemia: Triglycerides up to the 1156, usually responding very well to exercise more than diet + FH CAD, father age 76, 2011- (-) GTX , Cor Ca+ score: zero 08-2018 Internal hemorrhoids  PLAN Here for CPX Hyperglycemia: Recheck A1c Dyslipidemia: Reports good compliance with Lipitor, fish oil and fenofibrate.  Not doing well with diet or exercise.  He has a  strong family history of CAD, calcium coronary score November 2019: zero. LDL few months ago 122, triglycerides 391 (baseline > 1000). EKG today: NSR, no change from previous Check FLP, consider cholesterol clinic re- eval FH CAD: See above.  On aspirin daily. RTC for labs fasting RTC office visit 6 months      This visit occurred during the SARS-CoV-2 public health emergency.  Safety protocols were in place, including screening questions prior to the visit, additional usage of staff PPE, and extensive cleaning of exam room while observing appropriate contact time as indicated for disinfecting solutions.

## 2021-09-01 NOTE — Patient Instructions (Addendum)
  Go to the [facility]  office at your earliest convenience fasting for blood work  Proceed with your second Shingrix vaccine  Proceed with a COVID booster with a Bivalent shot  NSR.  No changes.  GO TO THE FRONT DESK, PLEASE SCHEDULE YOUR APPOINTMENTS  Come back for a checkup in 6 months

## 2021-09-02 ENCOUNTER — Encounter: Payer: Self-pay | Admitting: Internal Medicine

## 2021-09-02 NOTE — Assessment & Plan Note (Signed)
ASSESSMENT Hyperglycemia Dyslipidemia: Triglycerides up to the 1156, usually responding very well to exercise more than diet + FH CAD, father age 53, 2011- (-) GTX , Cor Ca+ score: zero 08-2018 Internal hemorrhoids  PLAN Here for CPX Hyperglycemia: Recheck A1c Dyslipidemia: Reports good compliance with Lipitor, fish oil and fenofibrate.  Not doing well with diet or exercise.  He has a strong family history of CAD, calcium coronary score November 2019: zero. LDL few months ago 122, triglycerides 391 (baseline > 1000). EKG today: NSR, no change from previous Check FLP, consider cholesterol clinic re- eval FH CAD: See above.  On aspirin daily. RTC for labs fasting RTC office visit 6 months

## 2021-09-02 NOTE — Assessment & Plan Note (Signed)
-  Tdap 03/2021 -Shingrix x1, rec to proceed w/ #2;   COVID vaccine: Rec bivalent booster Flu shot today CCS: Colonoscopy 10-2019, next per GI  Prostate ca screening: declines DRE, check PSA. Lifestyle: Strongly encouraged to watch diet and go back to routine exercise  Labs:  CMP, FLP, A1c, TSH, PSA, A1c, hep C Advance care planning package provided

## 2021-09-05 ENCOUNTER — Other Ambulatory Visit (INDEPENDENT_AMBULATORY_CARE_PROVIDER_SITE_OTHER): Payer: BC Managed Care – PPO

## 2021-09-05 DIAGNOSIS — R739 Hyperglycemia, unspecified: Secondary | ICD-10-CM

## 2021-09-05 DIAGNOSIS — Z0000 Encounter for general adult medical examination without abnormal findings: Secondary | ICD-10-CM | POA: Diagnosis not present

## 2021-09-05 DIAGNOSIS — E781 Pure hyperglyceridemia: Secondary | ICD-10-CM

## 2021-09-05 DIAGNOSIS — Z1159 Encounter for screening for other viral diseases: Secondary | ICD-10-CM | POA: Diagnosis not present

## 2021-09-05 LAB — COMPREHENSIVE METABOLIC PANEL
ALT: 30 U/L (ref 0–53)
AST: 22 U/L (ref 0–37)
Albumin: 4.6 g/dL (ref 3.5–5.2)
Alkaline Phosphatase: 57 U/L (ref 39–117)
BUN: 21 mg/dL (ref 6–23)
CO2: 27 mEq/L (ref 19–32)
Calcium: 10 mg/dL (ref 8.4–10.5)
Chloride: 103 mEq/L (ref 96–112)
Creatinine, Ser: 1.04 mg/dL (ref 0.40–1.50)
GFR: 82.27 mL/min (ref >60.00)
Glucose, Bld: 103 mg/dL — ABNORMAL HIGH (ref 70–99)
Potassium: 4 mEq/L (ref 3.5–5.1)
Sodium: 138 mEq/L (ref 135–145)
Total Bilirubin: 0.4 mg/dL (ref 0.2–1.2)
Total Protein: 7.7 g/dL (ref 6.0–8.3)

## 2021-09-05 LAB — LIPID PANEL
Cholesterol: 210 mg/dL — ABNORMAL HIGH (ref 0–200)
HDL: 50.4 mg/dL (ref >39.00)
LDL Cholesterol: 121 mg/dL — ABNORMAL HIGH (ref 0–99)
NonHDL: 159.79
Total CHOL/HDL Ratio: 4
Triglycerides: 193 mg/dL — ABNORMAL HIGH (ref 0.0–149.0)
VLDL: 38.6 mg/dL (ref 0.0–40.0)

## 2021-09-05 LAB — TSH: TSH: 1.68 u[IU]/mL (ref 0.35–5.50)

## 2021-09-05 LAB — HEPATITIS C ANTIBODY
Hepatitis C Ab: NONREACTIVE
SIGNAL TO CUT-OFF: 0.03 (ref <1.00)

## 2021-09-05 LAB — HEMOGLOBIN A1C: Hgb A1c MFr Bld: 6.2 % (ref 4.6–6.5)

## 2021-09-05 LAB — PSA: PSA: 0.68 ng/mL (ref 0.10–4.00)

## 2021-09-08 MED ORDER — ATORVASTATIN CALCIUM 80 MG PO TABS: 80.0000 mg | ORAL_TABLET | Freq: Every day | ORAL | 1 refills | Status: DC

## 2021-11-18 DIAGNOSIS — N6081 Other benign mammary dysplasias of right breast: Secondary | ICD-10-CM | POA: Diagnosis not present

## 2021-11-18 DIAGNOSIS — R921 Mammographic calcification found on diagnostic imaging of breast: Secondary | ICD-10-CM | POA: Diagnosis not present

## 2022-01-02 DIAGNOSIS — N6091 Unspecified benign mammary dysplasia of right breast: Secondary | ICD-10-CM | POA: Diagnosis not present

## 2022-01-08 ENCOUNTER — Other Ambulatory Visit: Payer: Self-pay | Admitting: Internal Medicine

## 2022-01-08 MED ORDER — ATORVASTATIN CALCIUM 80 MG PO TABS: 80.0000 mg | ORAL_TABLET | Freq: Every day | ORAL | 1 refills | Status: DC

## 2022-01-14 ENCOUNTER — Other Ambulatory Visit: Payer: Self-pay | Admitting: Internal Medicine

## 2022-02-17 ENCOUNTER — Telehealth: Payer: Self-pay | Admitting: Internal Medicine

## 2022-02-17 MED ORDER — AZITHROMYCIN 250 MG PO TABS: ORAL_TABLET | ORAL | 0 refills | Status: DC

## 2022-02-17 NOTE — Telephone Encounter (Signed)
Symptoms started 2 days ago: ?Mild sore throat, mild cough, some sputum production, greenish/yellow. ?No actual fevers that he can tell. ?+ Sinus congestion ?He is leaving town in 2 days and would like something sent. ?URI, early bronchitis? ?Plan: ?Good hydration ?Mucinex ?Low-dose Sudafed as needed ?Zithromax sent. ?Call if not gradually better. ?

## 2022-03-04 ENCOUNTER — Ambulatory Visit: Payer: BC Managed Care – PPO | Admitting: Internal Medicine

## 2022-05-06 DIAGNOSIS — L649 Androgenic alopecia, unspecified: Secondary | ICD-10-CM | POA: Diagnosis not present

## 2022-05-06 DIAGNOSIS — B351 Tinea unguium: Secondary | ICD-10-CM | POA: Diagnosis not present

## 2022-05-06 DIAGNOSIS — Z5181 Encounter for therapeutic drug level monitoring: Secondary | ICD-10-CM | POA: Diagnosis not present

## 2022-05-06 DIAGNOSIS — B353 Tinea pedis: Secondary | ICD-10-CM | POA: Diagnosis not present

## 2022-06-03 ENCOUNTER — Encounter: Payer: Self-pay | Admitting: Internal Medicine

## 2022-06-06 ENCOUNTER — Other Ambulatory Visit: Payer: Self-pay | Admitting: Internal Medicine

## 2022-06-12 DIAGNOSIS — Z79899 Other long term (current) drug therapy: Secondary | ICD-10-CM | POA: Diagnosis not present

## 2022-06-12 DIAGNOSIS — B351 Tinea unguium: Secondary | ICD-10-CM | POA: Diagnosis not present

## 2022-06-23 ENCOUNTER — Other Ambulatory Visit: Payer: Self-pay | Admitting: Internal Medicine

## 2022-09-01 ENCOUNTER — Ambulatory Visit: Payer: BC Managed Care – PPO | Admitting: Internal Medicine

## 2022-09-02 DIAGNOSIS — L821 Other seborrheic keratosis: Secondary | ICD-10-CM | POA: Diagnosis not present

## 2022-09-02 DIAGNOSIS — D225 Melanocytic nevi of trunk: Secondary | ICD-10-CM | POA: Diagnosis not present

## 2022-09-02 DIAGNOSIS — L578 Other skin changes due to chronic exposure to nonionizing radiation: Secondary | ICD-10-CM | POA: Diagnosis not present

## 2022-09-02 DIAGNOSIS — B353 Tinea pedis: Secondary | ICD-10-CM | POA: Diagnosis not present

## 2022-09-15 ENCOUNTER — Ambulatory Visit (INDEPENDENT_AMBULATORY_CARE_PROVIDER_SITE_OTHER): Payer: BC Managed Care – PPO | Admitting: Internal Medicine

## 2022-09-15 ENCOUNTER — Encounter: Payer: Self-pay | Admitting: Internal Medicine

## 2022-09-15 VITALS — BP 126/68 | HR 71 | Temp 98.1°F | Resp 18 | Ht 69.0 in | Wt 223.4 lb

## 2022-09-15 DIAGNOSIS — Z0000 Encounter for general adult medical examination without abnormal findings: Secondary | ICD-10-CM

## 2022-09-15 DIAGNOSIS — E781 Pure hyperglyceridemia: Secondary | ICD-10-CM

## 2022-09-15 DIAGNOSIS — G4452 New daily persistent headache (NDPH): Secondary | ICD-10-CM

## 2022-09-15 DIAGNOSIS — Z23 Encounter for immunization: Secondary | ICD-10-CM | POA: Diagnosis not present

## 2022-09-15 DIAGNOSIS — R739 Hyperglycemia, unspecified: Secondary | ICD-10-CM | POA: Diagnosis not present

## 2022-09-15 MED ORDER — ALPRAZOLAM 0.5 MG PO TABS: 0.5000 mg | ORAL_TABLET | Freq: Two times a day (BID) | ORAL | 0 refills | Status: AC | PRN

## 2022-09-15 NOTE — Progress Notes (Unsigned)
Subjective:    Patient ID: Robert Soto, male    DOB: September 07, 1966, 56 y.o.   MRN: [id number]  DOS:  09/15/2022 Type of visit - description: CPX  Here for CPX General feeling well although he has some concerns. 4 days ago, during sexual activity, had a severe headache, global headache. This is the worst headache he ever had. Not associated nausea, vomiting, diplopia or other symptoms. Headache lasted few minutes and self resolved. Has chronic neck problems but denies any neck stiffness.  When asked, admits that had a couple of previous events of headache associated with coitus however they were never as severe. Also, occasionally erections are not as lasting as before.  Wt Readings from Last 3 Encounters:  09/15/22 223 lb 6 oz (101.3 kg)  09/01/21 225 lb 6 oz (102.2 kg)  05/20/21 219 lb (99.3 kg)   Review of Systems  Other than above, a 14 point review of systems is negative      Past Medical History:  Diagnosis Date   Allergy    High triglycerides    hx. 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[employer]   Tobacco Use   Smoking status: Former   Smokeless tobacco: Never   Tobacco comments:    early 20's smoked at parties  Substance and Sexual Activity   Alcohol use: Yes    Comment: socially    Drug use: No   Sexual activity: Not on file  Other Topics Concern   Not on file  Social History Narrative   Original from [place]   Social Determinants of Health   Financial Resource Strain: Not on file  Food Insecurity: Not on file  Transportation Needs:  Not on file  Physical Activity: Not on file  Stress: Not on file  Social Connections: Not on file  Intimate Partner Violence: Not on file    Current Outpatient Medications  Medication Instructions   ALPRAZolam (XANAX) 0.5 mg, Oral, 2 times daily PRN   aspirin 81 mg, Daily   atorvastatin (LIPITOR) 80 mg, Oral, Daily at bedtime   cetirizine (ZYRTEC) 10 mg, Oral, Daily, In the cold weather    fenofibrate 160 mg, Oral, Daily   fish oil-omega-3 fatty acids 2 g, Daily   molnupiravir EUA (LAGEVRIO) 800 mg, Oral, 2 times daily   vitamin C 1,000 mg, Oral, Daily       Objective:   Physical Exam BP 126/68   Pulse 71   Temp 98.1 F (36.7 C) (Oral)   Resp 18   Ht '5\' 9"'$  (1.753 m)   Wt 223 lb 6 oz (101.3 kg)   SpO2 98%   BMI 32.99 kg/m  General: Well developed, NAD, BMI noted Neck: No  thyromegaly.  Range of motion slightly limited, not a new issue, no stiffness noted. HEENT:  Normocephalic . Face symmetric, atraumatic Lungs:  CTA B Normal respiratory effort, no intercostal retractions, no accessory muscle use. Heart: RRR,  no murmur.  Abdomen:  Not distended, soft, non-tender. No rebound or rigidity.  Lower extremities: no pretibial edema bilaterally  Skin: Exposed areas without rash. Not pale. Not jaundice Neurologic:  alert & oriented X3.  Speech normal, gait appropriate for age and unassisted.  EOMI. Strength symmetric and appropriate for age.  Psych: Cognition and judgment appear intact.  Cooperative with normal attention span and concentration.  Behavior appropriate. No anxious or depressed appearing.     Assessment      ASSESSMENT Hyperglycemia Dyslipidemia: Triglycerides up to the 1156, usually responding very well to exercise more than diet + FH CAD, father age 74, 2011- (-) GTX , Cor Ca+ score: zero 08-2018 Internal hemorrhoids  PLAN Here for CPX Hyperglycemia: Check A1c Dyslipidemia: Reports good compliance with atorvastatin, fenofibrate, fish oil.   Checking labs. Headache:  Acute headache, during coitus, self resolved, sounds like a thunderclap headache, worst headache of his life.  Must rule out ICB. Will order MRI/MRA without contrast  but that may take several days to be approved by his insurance and he will leave the country soon thus will do CT head today. Encouraged to seek medical attention if he has another severe headache. Erectile dysfunction?  Mild, as described above, we agreed on observation. Xanax: Refilled today.  PDMP okay. Neck problems: Has chronic decreased ROM of the neck, to see a sports medicine doctor soon RTC 6 months.

## 2022-09-15 NOTE — Assessment & Plan Note (Signed)
-  Tdap 03/2021 -Shingrix x 2 per patient COVID vaccine: Rec bivalent booster Flu shot today CCS: Colonoscopy 10-2019, next per GI  Prostate ca screening: No symptoms, check PSA Lifestyle: Not doing well lately, encouraged routine exercise and a healthy diet. Labs:  CBC CMP A1c FLP PSA [facility] POA: Recommended

## 2022-09-15 NOTE — Patient Instructions (Addendum)
They should be calling you about the CT scan of your head today or tomorrow.  I will be working on getting a MRI/MRA of the brain  If you have a severe headache as you did before, seek medical attention  Vaccines I recommend: COVID booster     GO TO THE LAB : Get the blood work     [place], [place] back for checkup in 6 months

## 2022-09-16 ENCOUNTER — Telehealth: Payer: Self-pay | Admitting: Internal Medicine

## 2022-09-16 ENCOUNTER — Encounter: Payer: Self-pay | Admitting: Internal Medicine

## 2022-09-16 LAB — CBC WITH DIFFERENTIAL/PLATELET
Basophils Absolute: 0.1 10*3/uL (ref 0.0–0.1)
Basophils Relative: 0.9 % (ref 0.0–3.0)
Eosinophils Absolute: 0.2 10*3/uL (ref 0.0–0.7)
Eosinophils Relative: 2.5 % (ref 0.0–5.0)
HCT: 44.9 % (ref 39.0–52.0)
Hemoglobin: 15.1 g/dL (ref 13.0–17.0)
Lymphocytes Relative: 28.7 % (ref 12.0–46.0)
Lymphs Abs: 2.3 10*3/uL (ref 0.7–4.0)
MCHC: 33.6 g/dL (ref 30.0–36.0)
MCV: 88.5 fl (ref 78.0–100.0)
Monocytes Absolute: 0.8 10*3/uL (ref 0.1–1.0)
Monocytes Relative: 10.3 % (ref 3.0–12.0)
Neutro Abs: 4.7 10*3/uL (ref 1.4–7.7)
Neutrophils Relative %: 57.6 % (ref 43.0–77.0)
Platelets: 275 10*3/uL (ref 150.0–400.0)
RBC: 5.07 Mil/uL (ref 4.22–5.81)
RDW: 12.9 % (ref 11.5–15.5)
WBC: 8.1 10*3/uL (ref 4.0–10.5)

## 2022-09-16 LAB — LIPID PANEL
Cholesterol: 222 mg/dL — ABNORMAL HIGH (ref 0–200)
HDL: 49.8 mg/dL (ref >39.00)
NonHDL: 172.63
Total CHOL/HDL Ratio: 4
Triglycerides: 292 mg/dL — ABNORMAL HIGH (ref 0.0–149.0)
VLDL: 58.4 mg/dL — ABNORMAL HIGH (ref 0.0–40.0)

## 2022-09-16 LAB — COMPREHENSIVE METABOLIC PANEL
ALT: 30 U/L (ref 0–53)
AST: 23 U/L (ref 0–37)
Albumin: 4.8 g/dL (ref 3.5–5.2)
Alkaline Phosphatase: 55 U/L (ref 39–117)
BUN: 16 mg/dL (ref 6–23)
CO2: 29 mEq/L (ref 19–32)
Calcium: 10.3 mg/dL (ref 8.4–10.5)
Chloride: 100 mEq/L (ref 96–112)
Creatinine, Ser: 0.94 mg/dL (ref 0.40–1.50)
GFR: 92.21 mL/min (ref >60.00)
Glucose, Bld: 86 mg/dL (ref 70–99)
Potassium: 4.1 mEq/L (ref 3.5–5.1)
Sodium: 138 mEq/L (ref 135–145)
Total Bilirubin: 0.6 mg/dL (ref 0.2–1.2)
Total Protein: 7.6 g/dL (ref 6.0–8.3)

## 2022-09-16 LAB — LDL CHOLESTEROL, DIRECT: Direct LDL: 132 mg/dL

## 2022-09-16 LAB — PSA: PSA: 0.72 ng/mL (ref 0.10–4.00)

## 2022-09-16 LAB — HEMOGLOBIN A1C: Hgb A1c MFr Bld: 6.5 % (ref 4.6–6.5)

## 2022-09-16 MED ORDER — MOLNUPIRAVIR EUA 200MG CAPSULE: 4.0000 | ORAL_CAPSULE | Freq: Two times a day (BID) | ORAL | 0 refills | Status: AC

## 2022-09-16 NOTE — Progress Notes (Deleted)
[place] Phone: [phone] Subjective:    I'm seeing this patient by the request  of:  Colon Branch, MD  CC: neck pain   [id number]  Robert Soto is a 56 y.o. male coming in with complaint of neck pain. Patient states        Past Medical History:  Diagnosis Date   Allergy    High triglycerides    hx. 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bus[employer]   Tobacco Use   Smoking status: Former   Smokeless tobacco: Never   Tobacco comments:    early 20's smoked at parties  Substance and Sexual Activity   Alcohol use: Yes    Comment: socially    Drug use: No   Sexual activity: Not on file  Other Topics Concern   Not on file  Social History Narrative   Original from [place]   Social Determinants of Health   Financial Resource Strain: Not on file  Food Insecurity: Not on file  Transportation Needs: Not on file  Physical Activity: Not on file  Stress: Not on file  Social Connections: Not on file   No Known Allergies Family History  Problem Relation Age of Onset   Coronary artery disease Mother        due to septic shock   Heart attack Father 51   Stroke Other        aunt   Coronary artery disease Other        a number of uncles    Diabetes Neg Hx    Colon cancer Neg Hx    Prostate cancer Neg Hx    Colon polyps Neg Hx    Rectal cancer Neg Hx    Stomach cancer Neg Hx      Current Outpatient Medications (Cardiovascular):    atorvastatin (LIPITOR) 80 MG tablet, Take 1  tablet by mouth at bedtime.   fenofibrate 160 MG tablet, Take 1 tablet (160 mg total) by mouth daily.  Current Outpatient Medications (Respiratory):    cetirizine (ZYRTEC) 10 MG tablet, Take 10 mg by mouth daily. In the cold weather  Current Outpatient Medications (Analgesics):    aspirin 81 MG tablet, Take 81 mg by mouth daily.   Current Outpatient Medications (Other):    ALPRAZolam (XANAX) 0.5 MG tablet, Take 1 tablet (0.5 mg total) by mouth 2 (two) times daily as needed for anxiety.   Ascorbic Acid (VITAMIN C) 1000 MG tablet, Take 1,000 mg by mouth daily.   fish oil-omega-3 fatty acids 1000 MG capsule, Take 2 g by mouth daily.    molnupiravir EUA (LAGEVRIO) 200 mg CAPS capsule, Take 4 capsules (800 mg total) by mouth 2 (two) times daily for 5 days.   Reviewed prior external information including notes and imaging from  primary care provider As well as notes that were available from care everywhere and other healthcare systems.  Past medical history, social, surgical and family history all reviewed in  electronic medical record.  No pertanent information unless stated regarding to the chief complaint.   Review of Systems:  No headache, visual changes, nausea, vomiting, diarrhea, constipation, dizziness, abdominal pain, skin rash, fevers, chills, night sweats, weight loss, swollen lymph nodes, body aches, joint swelling, chest pain, shortness of breath, mood changes. POSITIVE muscle aches  Objective  There were no vitals taken for this visit.   General: No apparent distress alert and oriented x3 mood and affect normal, dressed appropriately.  HEENT: Pupils equal, extraocular movements intact  Respiratory: Patient's speak in full sentences and does not appear short of breath  Cardiovascular: No lower extremity edema, non tender, no erythema      Impression and Recommendations:

## 2022-09-16 NOTE — Telephone Encounter (Signed)
Rx sent 

## 2022-09-16 NOTE — Telephone Encounter (Signed)
Patient started with runny nose 2 days ago. Last night had a low-grade fever, otherwise feels okay. He went ahead and tested for COVID, it came back positive. Plan: - Please send molnupiravir - Rest, fluids, Tylenol, seek attention if he feels worse. - See message

## 2022-09-16 NOTE — Assessment & Plan Note (Signed)
Here for CPX Hyperglycemia: Check A1c Dyslipidemia: Reports good compliance with atorvastatin, fenofibrate, fish oil.  Checking labs. Headache:  Acute headache, during coitus, self resolved, sounds like a thunderclap headache, worst headache of his life.  Must rule out ICB. Will order MRI/MRA without contrast  but that may take several days to be approved by his insurance and he will leave the country soon thus will do CT head today. Encouraged to seek medical attention if he has another severe headache. Erectile dysfunction?  Mild, as described above, we agreed on observation. Xanax: Refilled today.  PDMP okay. Neck problems: Has chronic decreased ROM of the neck, to see a sports medicine doctor soon RTC 6 months.

## 2022-09-17 ENCOUNTER — Other Ambulatory Visit: Payer: BC Managed Care – PPO

## 2022-09-17 DIAGNOSIS — G4452 New daily persistent headache (NDPH): Secondary | ICD-10-CM | POA: Diagnosis not present

## 2022-09-17 DIAGNOSIS — R519 Headache, unspecified: Secondary | ICD-10-CM | POA: Diagnosis not present

## 2022-09-17 NOTE — Addendum Note (Signed)
Addended byDamita Dunnings D on: 09/17/2022 08:11 AM   Modules accepted: Orders

## 2022-09-17 NOTE — Addendum Note (Signed)
Addended byDamita Dunnings D on: 09/17/2022 08:40 AM   Modules accepted: Orders

## 2022-09-18 ENCOUNTER — Ambulatory Visit: Payer: BC Managed Care – PPO | Admitting: Family Medicine

## 2022-09-18 NOTE — Addendum Note (Signed)
Addended byDamita Dunnings D on: 09/18/2022 10:36 AM   Modules accepted: Orders

## 2022-09-21 ENCOUNTER — Ambulatory Visit: Payer: BC Managed Care – PPO | Admitting: Family Medicine

## 2022-09-29 ENCOUNTER — Other Ambulatory Visit: Payer: Self-pay | Admitting: *Deleted

## 2022-09-29 MED ORDER — ATORVASTATIN CALCIUM 80 MG PO TABS: 80.0000 mg | ORAL_TABLET | Freq: Every day | ORAL | 1 refills | Status: DC

## 2022-10-06 NOTE — Progress Notes (Signed)
[place] Phone: [phone] Subjective:   Fontaine No, am serving as a scribe for Dr. Hulan Saas.  I'm seeing this patient by the request  of:  Colon Branch, MD  CC: Neck pain   [id number]  Jams Trickett is a 57 y.o. male coming in with complaint of neck pain for 2 years. Patient states that he was training for half ironman in 2022 and thought he could lift the same weights as his son. After a day of heavy lifting he felt like he was unable to rotate neck. Currently he has limited ROM with rotation to the L. Has tried PT which helped to alleviate his pain. Painful at C7T1 if he does try to workout hard. Denies any radiating symptoms. Did not workout at all last year but wants to start training again for half ironman.       Past Medical History:  Diagnosis Date   Allergy    High triglycerides    hx. 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[employer]   Tobacco Use   Smoking status: Former   Smokeless tobacco: Never   Tobacco comments:    early 20's smoked at parties  Substance and Sexual Activity   Alcohol use: Yes    Comment: socially    Drug use: No   Sexual activity: Not on file  Other Topics Concern   Not on file  Social History Narrative   Original from [place]   Social Determinants of Health   Financial Resource Strain: Not on file  Food Insecurity: Not on file  Transportation Needs: Not on file  Physical Activity: Not on file  Stress: Not on file  Social Connections: Not  on file   No Known Allergies Family History  Problem Relation Age of Onset   Coronary artery disease Mother        due to septic shock   Heart attack Father 2   Stroke Other        aunt   Coronary artery disease Other        a number of uncles    Diabetes Neg Hx    Colon cancer Neg Hx    Prostate cancer Neg Hx    Colon polyps Neg Hx    Rectal cancer Neg Hx    Stomach cancer Neg Hx      Current Outpatient Medications (Cardiovascular):    atorvastatin (LIPITOR) 80 MG tablet, Take 1 tablet (80 mg total) by mouth at bedtime.   fenofibrate 160 MG tablet, Take 1 tablet (160 mg total) by mouth daily.  Current Outpatient Medications (Respiratory):    cetirizine (ZYRTEC) 10 MG tablet, Take 10 mg by mouth daily. In the cold weather  Current Outpatient Medications (Analgesics):    aspirin 81 MG tablet, Take 81 mg by mouth daily.   Current Outpatient Medications (Other):    ALPRAZolam (XANAX) 0.5 MG tablet, Take 1 tablet (0.5 mg total) by mouth 2 (two) times  daily as needed for anxiety.   Ascorbic Acid (VITAMIN C) 1000 MG tablet, Take 1,000 mg by mouth daily.   fish oil-omega-3 fatty acids 1000 MG capsule, Take 2 g by mouth daily.    Reviewed prior external information including notes and imaging from  primary care provider As well as notes that were available from care everywhere and other healthcare systems.  Reviewed imaging from outside facility including a CT of the head as well as MR angio of the head in December 2023 that was unremarkable.  Past medical history, social, surgical and family history all reviewed in electronic medical record.  No pertanent information unless stated regarding to the chief complaint.   Review of Systems:  No headache, visual changes, nausea, vomiting, diarrhea, constipation, dizziness, abdominal pain, skin rash, fevers, chills, night sweats, weight loss, swollen lymph nodes, body aches, joint swelling, chest pain, shortness of breath, mood  changes. POSITIVE muscle aches  Objective  Blood pressure 122/80, pulse 69, height '5\' 9"'$  (1.753 m), SpO2 98 %.   General: No apparent distress alert and oriented x3 mood and affect normal, dressed appropriately.  HEENT: Pupils equal, extraocular movements intact  Respiratory: Patient's speak in full sentences and does not appear short of breath  Cardiovascular: No lower extremity edema, non tender, no erythema  Neck exam shows minimal range of motion in all planes.  Patient has 0 degrees of right-sided sidebending and only 5 degrees of left-sided rotation.  Patient only has 5 degrees of extension of the neck noted.  5 out of 5 strength of the hands and deep tendon reflexes intact    Impression and Recommendations:    The above documentation has been reviewed and is accurate and complete Lyndal Pulley, DO

## 2022-10-09 ENCOUNTER — Ambulatory Visit: Payer: BC Managed Care – PPO | Admitting: Family Medicine

## 2022-10-09 ENCOUNTER — Encounter: Payer: Self-pay | Admitting: Family Medicine

## 2022-10-09 ENCOUNTER — Ambulatory Visit (INDEPENDENT_AMBULATORY_CARE_PROVIDER_SITE_OTHER): Payer: BC Managed Care – PPO

## 2022-10-09 VITALS — BP 122/80 | HR 69 | Ht 69.0 in

## 2022-10-09 DIAGNOSIS — M542 Cervicalgia: Secondary | ICD-10-CM

## 2022-10-09 DIAGNOSIS — M2550 Pain in unspecified joint: Secondary | ICD-10-CM | POA: Diagnosis not present

## 2022-10-09 DIAGNOSIS — M546 Pain in thoracic spine: Secondary | ICD-10-CM | POA: Diagnosis not present

## 2022-10-09 DIAGNOSIS — M47812 Spondylosis without myelopathy or radiculopathy, cervical region: Secondary | ICD-10-CM

## 2022-10-09 DIAGNOSIS — M5134 Other intervertebral disc degeneration, thoracic region: Secondary | ICD-10-CM | POA: Diagnosis not present

## 2022-10-09 DIAGNOSIS — M5030 Other cervical disc degeneration, unspecified cervical region: Secondary | ICD-10-CM | POA: Diagnosis not present

## 2022-10-09 LAB — TESTOSTERONE: Testosterone: 287.46 ng/dL — ABNORMAL LOW (ref 300.00–890.00)

## 2022-10-09 LAB — FERRITIN: Ferritin: 203.6 ng/mL (ref 22.0–322.0)

## 2022-10-09 LAB — URIC ACID: Uric Acid, Serum: 5.9 mg/dL (ref 4.0–7.8)

## 2022-10-09 LAB — VITAMIN B12: Vitamin B-12: 448 pg/mL (ref 211–911)

## 2022-10-09 LAB — VITAMIN D 25 HYDROXY (VIT D DEFICIENCY, FRACTURES): VITD: 17.13 ng/mL — ABNORMAL LOW (ref 30.00–100.00)

## 2022-10-09 LAB — SEDIMENTATION RATE: Sed Rate: 34 mm/hr — ABNORMAL HIGH (ref 0–20)

## 2022-10-09 NOTE — Patient Instructions (Addendum)
MRI [facility] [phone] Labs We will be in touch

## 2022-10-09 NOTE — Assessment & Plan Note (Signed)
Patient does have severe arthritic changes.  Does have findings that are consistent with even ankylosing.  Will get laboratory workup to rule out any inflammatory or autoimmune that could be potentially contributing.  I do believe that this has been ongoing for quite some time and worsening likely over the course of the last 2 years.  We discussed with patient that there is a possibility of different medications.  States that he is not having as much pain but it was the range of motion that was in the more difficult aspect.  Due to the severity of this arthritic change in patient with a significant limitation also of the range of motion that is affecting daily activities including driving and the longevity of this I do feel that advanced imaging is warranted and MRI is ordered.  This will help [place] to see if there is any erosive changes or ankylosing that could be potentially contributing.  Follow-up with me after MRI to discuss further.

## 2022-10-13 LAB — ANA: Anti Nuclear Antibody (ANA): POSITIVE — AB

## 2022-10-13 LAB — RHEUMATOID FACTOR: Rheumatoid fact SerPl-aCnc: <14 IU/mL (ref <14)

## 2022-10-13 LAB — ANTI-NUCLEAR AB-TITER (ANA TITER): ANA Titer 1: 1:320 {titer} — ABNORMAL HIGH

## 2022-10-13 LAB — PTH, INTACT AND CALCIUM
Calcium: 10.1 mg/dL (ref 8.6–10.3)
PTH: 14 pg/mL — ABNORMAL LOW (ref 16–77)

## 2022-10-13 LAB — PROTEIN ELECTROPHORESIS, SERUM
Albumin ELP: 4.7 g/dL (ref 3.8–4.8)
Alpha 1: 0.3 g/dL (ref 0.2–0.3)
Alpha 2: 0.8 g/dL (ref 0.5–0.9)
Beta 2: 0.4 g/dL (ref 0.2–0.5)
Beta Globulin: 0.6 g/dL (ref 0.4–0.6)
Gamma Globulin: 1.2 g/dL (ref 0.8–1.7)
Total Protein: 8 g/dL (ref 6.1–8.1)

## 2022-10-13 LAB — HLA-B27 ANTIGEN: HLA-B27 Antigen: NEGATIVE

## 2022-10-13 LAB — CYCLIC CITRUL PEPTIDE ANTIBODY, IGG: Cyclic Citrullin Peptide Ab: <16 UNITS

## 2022-10-18 ENCOUNTER — Ambulatory Visit
Admission: RE | Admit: 2022-10-18 | Discharge: 2022-10-18 | Disposition: A | Discharge status: Home / Self Care | Payer: BC Managed Care – PPO | Source: Ambulatory Visit | Attending: Family Medicine | Admitting: Family Medicine

## 2022-10-18 DIAGNOSIS — M542 Cervicalgia: Secondary | ICD-10-CM

## 2022-10-19 IMAGING — DX DG CHEST 2V
2 series · 2 of 2 positions shown · non-contrast
Comparison: None.

CLINICAL DATA: Recent Y4RJD-A5 infection.

EXAM:
CHEST - 2 VIEW

[chest pa]
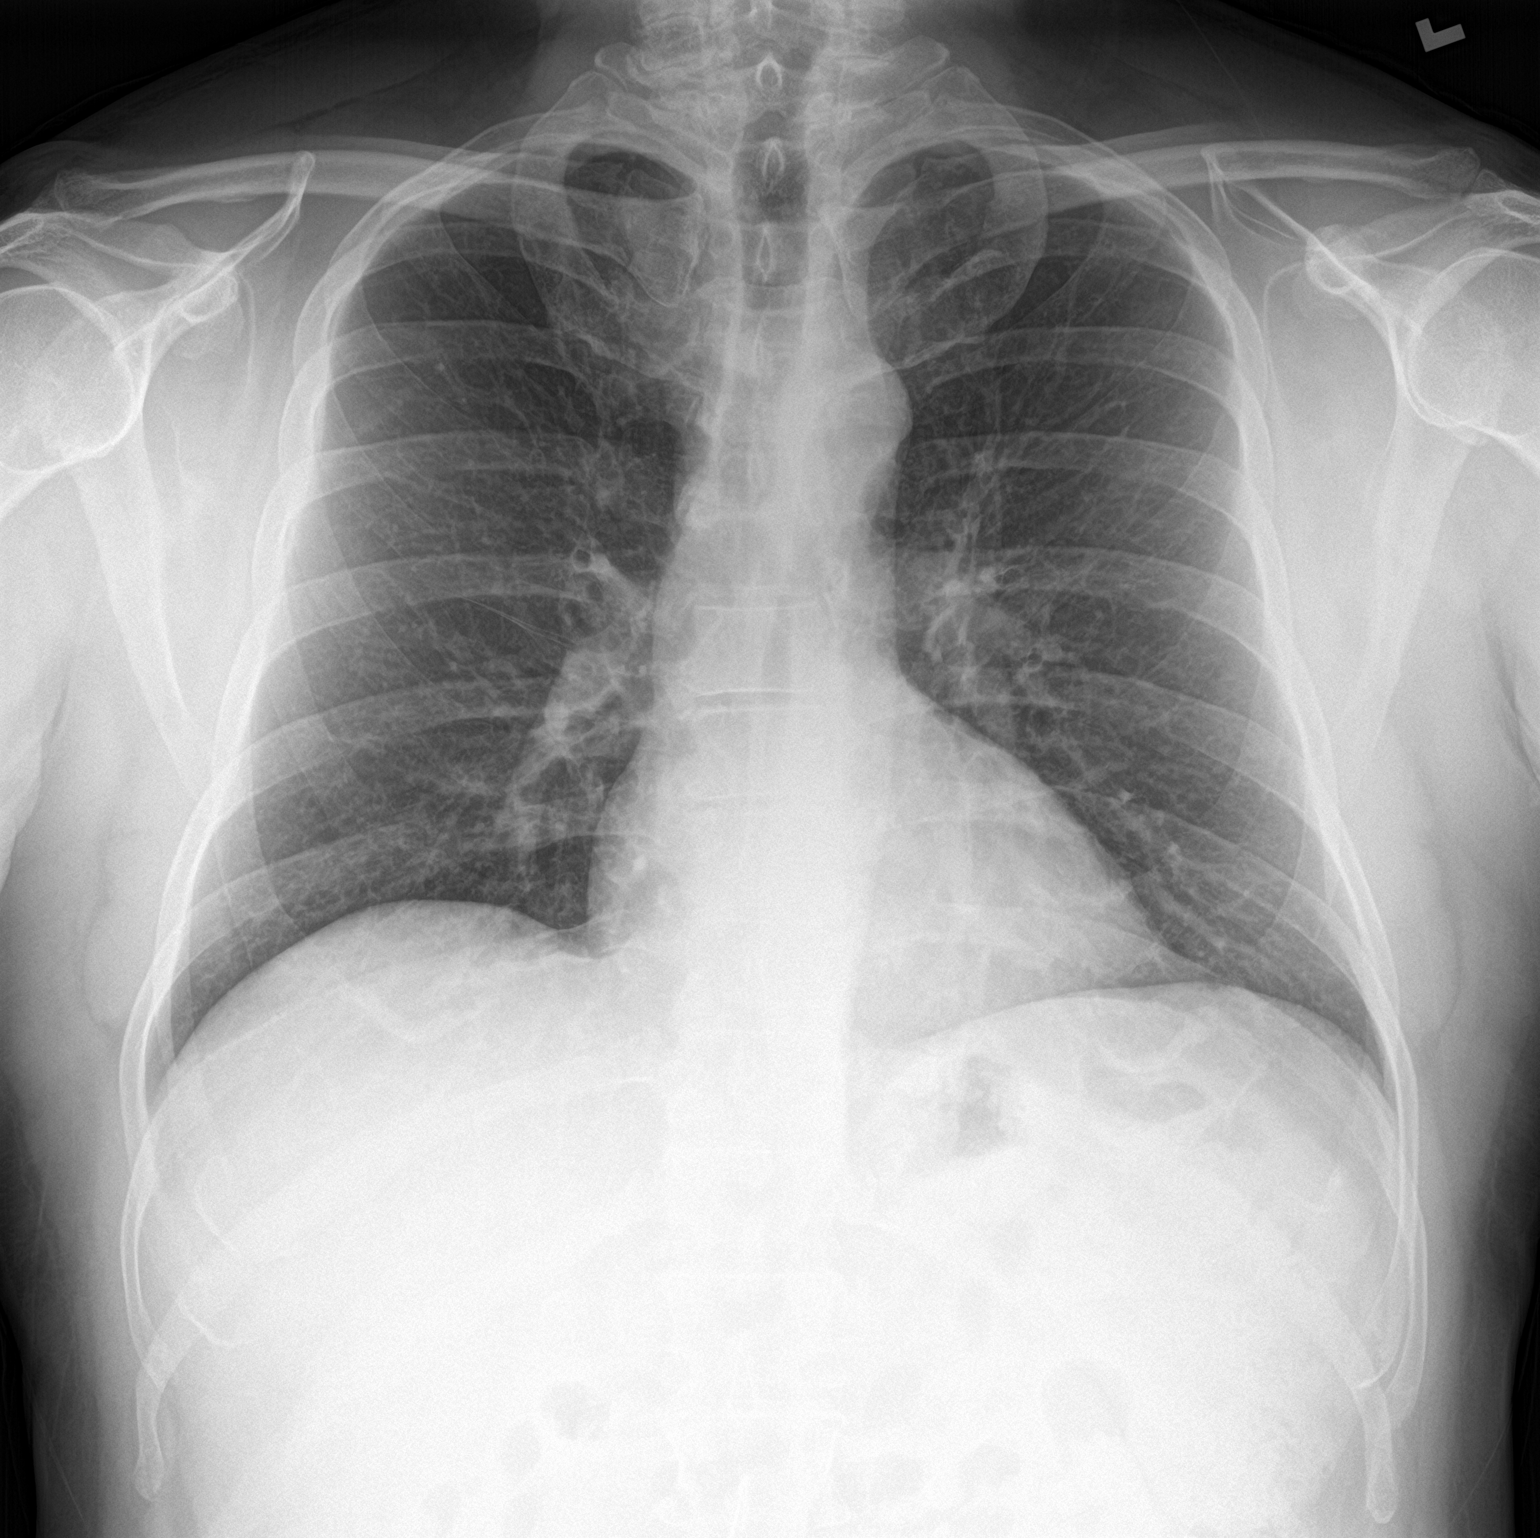

[chest lat]
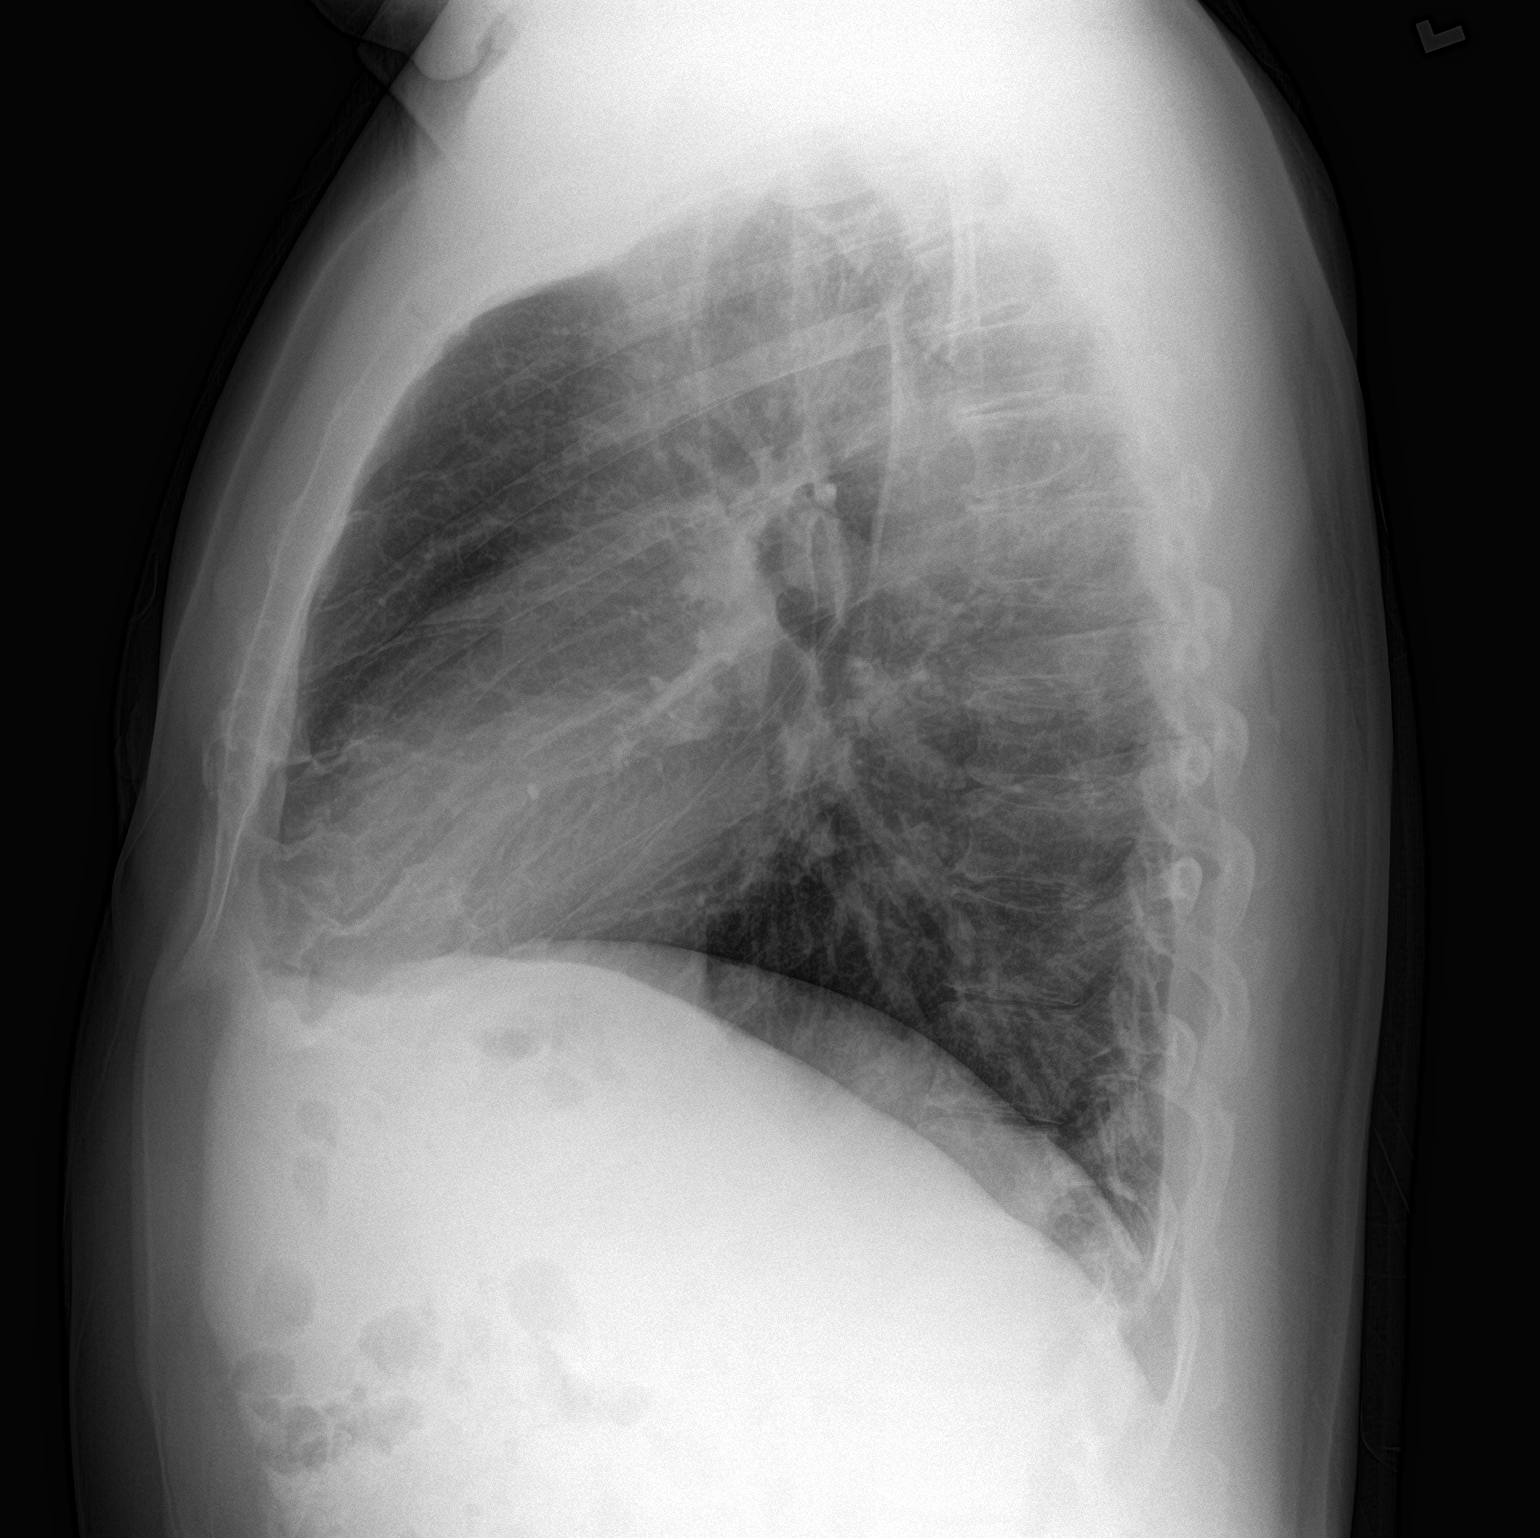

[2 of 2 positions shown; findings below may reference images not displayed]

FINDINGS: The heart size and mediastinal contours are within normal limits.
Both lungs are clear. The visualized skeletal structures are
unremarkable.
IMPRESSION: No active cardiopulmonary disease.

## 2022-10-21 ENCOUNTER — Other Ambulatory Visit: Payer: Self-pay

## 2022-10-21 DIAGNOSIS — R768 Other specified abnormal immunological findings in serum: Secondary | ICD-10-CM

## 2022-11-05 ENCOUNTER — Other Ambulatory Visit: Payer: Self-pay | Admitting: Internal Medicine

## 2022-12-25 ENCOUNTER — Other Ambulatory Visit: Payer: Self-pay | Admitting: Internal Medicine

## 2023-01-22 ENCOUNTER — Telehealth: Payer: Self-pay

## 2023-01-22 NOTE — Telephone Encounter (Signed)
Patient contacted the office and states he is out of town and will not make it to his new patient appointment on 01/26/2023 and would like to reschedule. Advised patient it will be months out. Patient call back number is [phone]. Please advise.

## 2023-01-26 ENCOUNTER — Encounter: Payer: BC Managed Care – PPO | Admitting: Internal Medicine

## 2023-01-26 NOTE — Progress Notes (Deleted)
Office Visit Note  Patient: Robert Soto             Date of Birth: 02-05-1966           MRN: [id number]             PCP: Wanda Plump, MD Referring: Judi Saa, DO Visit Date: 01/26/2023 Occupation: @  Subjective:  No chief complaint on file.   History of Present Illness: Robert Soto is a 57 y.o. male here for evaluation of neck pain with positive ANA, elevated sedimentation rate, and MRI changes with probable ankylosis C3-C6.***   Labs reviewed 10/2022 ANA 1:320 mitotic, centrosome ESR 34 Vit D 17.13 RF neg CCP neg HLA-B27 neg  MRI Cervical Spine  10/19/22 IMPRESSION: 1. No acute findings or explanation for the patient's symptoms. 2. Suspected multilevel interfacetal and interbody ankylosis from C3 through C6, asymmetric to the left. Lateral flexion extension radiographs or CT may be confirmatory. Consider ankylosing spondylitis. 3. Asymmetric facet hypertrophy on the right at C6-7 with associated subchondral edema which may contribute to neck pain. 4. No significant spinal stenosis or high-grade foraminal narrowing.  Activities of Daily Living:  Patient reports morning stiffness for *** {minute/hour:19697}.   Patient {ACTIONS;DENIES/REPORTS:21021675::"Denies"} nocturnal pain.  Difficulty dressing/grooming: {ACTIONS;DENIES/REPORTS:21021675::"Denies"} Difficulty climbing stairs: {ACTIONS;DENIES/REPORTS:21021675::"Denies"} Difficulty getting out of chair: {ACTIONS;DENIES/REPORTS:21021675::"Denies"} Difficulty using hands for taps, buttons, cutlery, and/or writing: {ACTIONS;DENIES/REPORTS:21021675::"Denies"}  No Rheumatology ROS completed.   PMFS History:  Patient Active Problem List   Diagnosis Date Noted   Degenerative arthritis of cervical spine 10/09/2022   Hyperglycemia 04/22/2018   PCP NOTES >>>>>>>>>>>>>>>>> 10/22/2016   Annual physical exam 05/05/2013   HYPERTRIGLYCERIDEMIA 08/05/2009    Past Medical History:  Diagnosis Date   Allergy     High triglycerides    hx. up to 900s- was treated with vytorin (d/c when he was training for marathon & TG decreased)   Hyperglycemia 04/22/2018    Family History  Problem Relation Age of Onset   Coronary artery disease Mother        due to septic shock   Heart attack Father 59   Stroke Other        aunt   Coronary artery disease Other        a number of uncles    Diabetes Neg Hx    Colon cancer Neg Hx    Prostate cancer Neg Hx    Colon polyps Neg Hx    Rectal cancer Neg Hx    Stomach cancer Neg Hx    Past Surgical History:  Procedure Laterality Date   TONSILLECTOMY  1976   VASECTOMY  2012   Social History   Social History Narrative   Original from [place]   Immunization History  Administered Date(s) Administered   Influenza Whole 08/05/2009, 08/13/2010   Influenza,inj,Quad PF,6+ Mos 10/20/2016, 08/25/2019, 08/27/2020, 09/01/2021, 09/15/2022   Moderna Sars-Covid-2 Vaccination 12/11/2019, 01/13/2020   Pfizer Covid-19 Vaccine Bivalent Booster 76yrs & up 09/05/2021   Td 08/13/2010   Tdap 03/24/2021   Zoster Recombinat (Shingrix) 06/20/2021, 09/05/2021     Objective: Vital Signs: There were no vitals taken for this visit.   Physical Exam   Musculoskeletal Exam: ***  CDAI Exam: CDAI Score: -- Patient Global: --; Provider Global: -- Swollen: --; Tender: -- Joint Exam 01/26/2023   No joint exam has been documented for this visit   There is currently no information documented on the homunculus. Go to the Rheumatology activity and complete the homunculus joint exam.  Investigation: No additional findings.  Imaging: No results found.  Recent Labs: Lab Results  Component Value Date   WBC 8.1 09/15/2022   HGB 15.1 09/15/2022   PLT 275.0 09/15/2022   NA 138 09/15/2022   K 4.1 09/15/2022   CL 100 09/15/2022   CO2 29 09/15/2022   GLUCOSE 86 09/15/2022   BUN 16 09/15/2022   CREATININE 0.94 09/15/2022   BILITOT 0.6 09/15/2022   ALKPHOS 55 09/15/2022    AST 23 09/15/2022   ALT 30 09/15/2022   PROT 8.0 10/09/2022   ALBUMIN 4.8 09/15/2022   CALCIUM 10.1 10/09/2022    Speciality Comments: No specialty comments available.  Procedures:  No procedures performed Allergies: Patient has no known allergies.   Assessment / Plan:     Visit Diagnoses: No diagnosis found.  Orders: No orders of the defined types were placed in this encounter.  No orders of the defined types were placed in this encounter.   Face-to-face time spent with patient was *** minutes. Greater than 50% of time was spent in counseling and coordination of care.  Follow-Up Instructions: No follow-ups on file.   Fuller Plan, MD  Note - This record has been created using [employer].  Chart creation errors have been sought, but may not always  have been located. Such creation errors do not reflect on  the standard of medical care.

## 2023-01-26 NOTE — Telephone Encounter (Signed)
LMOM for patient to call and reschedule.

## 2023-05-18 ENCOUNTER — Other Ambulatory Visit: Payer: Self-pay | Admitting: Internal Medicine

## 2023-05-20 DIAGNOSIS — Z1239 Encounter for other screening for malignant neoplasm of breast: Secondary | ICD-10-CM | POA: Diagnosis not present

## 2023-05-20 DIAGNOSIS — N6099 Unspecified benign mammary dysplasia of unspecified breast: Secondary | ICD-10-CM | POA: Diagnosis not present

## 2023-06-03 ENCOUNTER — Ambulatory Visit: Payer: BC Managed Care – PPO

## 2023-06-03 ENCOUNTER — Ambulatory Visit (INDEPENDENT_AMBULATORY_CARE_PROVIDER_SITE_OTHER): Payer: BC Managed Care – PPO

## 2023-06-03 ENCOUNTER — Ambulatory Visit: Payer: BC Managed Care – PPO | Attending: Internal Medicine | Admitting: Internal Medicine

## 2023-06-03 ENCOUNTER — Encounter: Payer: Self-pay | Admitting: Internal Medicine

## 2023-06-03 VITALS — BP 128/80 | HR 70 | Resp 15 | Ht 69.5 in | Wt 230.0 lb

## 2023-06-03 DIAGNOSIS — R700 Elevated erythrocyte sedimentation rate: Secondary | ICD-10-CM | POA: Diagnosis not present

## 2023-06-03 DIAGNOSIS — R768 Other specified abnormal immunological findings in serum: Secondary | ICD-10-CM | POA: Diagnosis not present

## 2023-06-03 DIAGNOSIS — M47812 Spondylosis without myelopathy or radiculopathy, cervical region: Secondary | ICD-10-CM

## 2023-06-03 DIAGNOSIS — M4320 Fusion of spine, site unspecified: Secondary | ICD-10-CM | POA: Diagnosis not present

## 2023-06-03 NOTE — Progress Notes (Signed)
Office Visit Note  Patient: Robert Soto             Date of Birth: [birth year]           MRN: [id number]             PCP: Wanda Plump, MD Referring: Judi Saa, DO Visit Date: 06/03/2023   Subjective:  New Patient (Initial Visit) (Difficulty turning neck, Dry needling and PT, )   History of Present Illness: Robert Soto is a 57 y.o. male here for evaluation of decreased range of motion of the cervical spine with ankylosis on imaging and a positive ANA. He is not sure about the specific onset of limited mobility but has been noticeable during around the past 2 years with some worsening over time. He had neck pain associated with increased weight training and exercise in 2022 working out with his son. He has had remote past injuries but never required any operation.  He was very active training for a half [employer] long distance running somewhat provoking his symptoms more.  He is more limited looking towards the left and then towards the right was not associated with pain no radiating symptoms.  He does not take any medicines for this.  Has seen sports medicine clinic had evaluation including x-ray and subsequent MRI indicative of interbody ankylosis between C3-C6 some degenerative arthritis at C6-C7.  Thoracic spine was not indicative of any ankylosis. Labs showed mildly elevated ESR and moderately high positive ANA but negative for HLA-B27 allele. Saw physical therapy and had treatment with dry needling these gave a partial benefit but has not been an ongoing process last seen more than a year ago. He does not have significant low back pain, no prolonged morning stiffness, no gastrointestinal symptoms, no eye inflammation redness or new visual changes. Denies severe dry eyes or mouth, oral nasal ulcers, cervical or axillary lymphadenopathy, Raynaud's symptoms, or abnormal bruising or bleeding.   Labs reviewed ANA 1:320 mitotic, centromere RF neg CCP neg HLA-B27 neg ESR 34 Uric acid  5.9 Vit D 17.13  Imaging Reviewed 10/18/22 MRI Cervical Spine IMPRESSION: 1. No acute findings or explanation for the patient's symptoms. 2. Suspected multilevel interfacetal and interbody ankylosis from C3 through C6, asymmetric to the left. Lateral flexion extension radiographs or CT may be confirmatory. Consider ankylosing spondylitis. 3. Asymmetric facet hypertrophy on the right at C6-7 with associated subchondral edema which may contribute to neck pain. 4. No significant spinal stenosis or high-grade foraminal narrowing.  Activities of Daily Living:  Patient reports morning stiffness for 0  none .   Patient Denies nocturnal pain.  Difficulty dressing/grooming: Denies Difficulty climbing stairs: Denies Difficulty getting out of chair: Denies Difficulty using hands for taps, buttons, cutlery, and/or writing: Denies  Review of Systems  Constitutional:  Negative for fatigue.  HENT:  Negative for mouth sores and mouth dryness.   Eyes:  Negative for dryness.  Respiratory:  Negative for shortness of breath.   Cardiovascular:  Negative for chest pain and palpitations.  Gastrointestinal:  Negative for blood in stool, constipation and diarrhea.  Endocrine: Negative for increased urination.  Genitourinary:  Negative for involuntary urination.  Musculoskeletal:  Negative for joint pain, gait problem, joint pain, joint swelling, myalgias, muscle weakness, morning stiffness, muscle tenderness and myalgias.  Skin:  Positive for hair loss. Negative for color change, rash and sensitivity to sunlight.  Allergic/Immunologic: Negative for susceptible to infections.  Neurological:  Positive for headaches. Negative for dizziness.  Hematological:  Negative for swollen glands.  Psychiatric/Behavioral:  Negative for depressed mood and sleep disturbance. The patient is not nervous/anxious.     PMFS History:  Patient Active Problem List   Diagnosis Date Noted   Sedimentation rate elevation 06/03/2023    Ankylosis of spine 06/03/2023   Degenerative arthritis of cervical spine 10/09/2022   Hyperglycemia 04/22/2018   PCP NOTES >>>>>>>>>>>>>>>>> 10/22/2016   Annual physical exam 05/05/2013   HYPERTRIGLYCERIDEMIA 08/05/2009    Past Medical History:  Diagnosis Date   Allergy    High triglycerides    hx. up to 900s- was treated with vytorin (d/c when he was training for marathon & TG decreased)   Hyperglycemia 04/22/2018    Family History  Problem Relation Age of Onset   Coronary artery disease Mother        due to septic shock   Heart attack Father 35   High Cholesterol Brother    Stroke Other        aunt   Coronary artery disease Other        a number of uncles    Diabetes Neg Hx    Colon cancer Neg Hx    Prostate cancer Neg Hx    Colon polyps Neg Hx    Rectal cancer Neg Hx    Stomach cancer Neg Hx    Past Surgical History:  Procedure Laterality Date   EYE SURGERY     TONSILLECTOMY  10/05/1974   VASECTOMY  10/05/2010   Social History   Social History Narrative   Original from [place]   Immunization History  Administered Date(s) Administered   Influenza Whole 08/05/2009, 08/13/2010   Influenza,inj,Quad PF,6+ Mos 10/20/2016, 08/25/2019, 08/27/2020, 09/01/2021, 09/15/2022   Moderna Sars-Covid-2 Vaccination 12/11/2019, 01/13/2020   Pfizer Covid-19 Vaccine Bivalent Booster 28yrs & up 09/05/2021   Td 08/13/2010   Tdap 03/24/2021   Zoster Recombinant(Shingrix) 06/20/2021, 09/05/2021     Objective: Vital Signs: BP 128/80 (BP Location: Right Arm, Patient Position: Sitting, Cuff Size: Normal)   Pulse 70   Resp 15   Ht 5' 9.5" (1.765 m)   Wt 230 lb (104.3 kg)   BMI 33.48 kg/m    Physical Exam HENT:     Mouth/Throat:     Mouth: Mucous membranes are moist.     Pharynx: Oropharynx is clear.  Eyes:     Conjunctiva/sclera: Conjunctivae normal.  Cardiovascular:     Rate and Rhythm: Normal rate and regular rhythm.  Pulmonary:     Effort: Pulmonary effort is  normal.     Breath sounds: Normal breath sounds.  Musculoskeletal:     Right lower leg: No edema.     Left lower leg: No edema.  Lymphadenopathy:     Cervical: No cervical adenopathy.  Skin:    General: Skin is warm and dry.     Findings: No rash.  Neurological:     Mental Status: He is alert.  Psychiatric:        Mood and Affect: Mood normal.      Musculoskeletal Exam:  Neck restricted lateral rotation more limited leftward than right, and severely reduced side pending, flexion and extension less obviously limited Shoulders full ROM no tenderness or swelling Elbows full ROM no tenderness or swelling Wrists full ROM no tenderness or swelling Fingers full ROM no tenderness or swelling No paraspinal tenderness to palpation over upper and lower back Hip normal internal and external rotation without pain, no tenderness to lateral hip palpation Knees full ROM no tenderness  or swelling Ankles full ROM no tenderness or swelling  Wall to tragus test normal, modified Schober test normal   Investigation: No additional findings.  Imaging: XR Pelvis 1-2 Views  Result Date: 06/03/2023 X-ray pelvis 2 views AP and Ferguson SI joints are patent bilaterally.  There is slightly increased sclerosis present more in the superior half of the joints with no focal joint narrowing or erosions seen.  Small enthesophytes at borders of iliac crest bilaterally.  Hip joints appear normal. Impression No significant appearing osteoarthritis and no radiographic sacroiliitis  XR Lumbar Spine 2-3 Views  Result Date: 06/03/2023 X-ray lumbar spine 2 views Appears to be some straightening of the normal lumbar lordosis.  No loss of vertebral body or intervertebral disc height.  No significant anterior endplate osteophytes or visible foraminal narrowing.  There are small lateral osteophyte processes at a few levels on the right side.  There may be a slight narrowing at the posterior edge of L5-S1. Impression No  significant appearing degenerative disc disease and no evidence of ankylosis  XR Cerv Spine Flex&Ext Only  Result Date: 06/03/2023 X-ray cervical spine 2 views lateral flexion and extension There are some multilevel degenerative changes most prominent with anterior osteophyte at C6-C7.  Appears to be calcification of the anterior ligaments and fusion at facet joints C6 and above with no change in relative position and flexed versus extended view.  No atlantoaxial subluxation. Impression Imaging appears consistent with ankylosis with associated loss of mobility in multiple levels C6 and above, with DJD below with intact mobility   Recent Labs: Lab Results  Component Value Date   WBC 8.1 09/15/2022   HGB 15.1 09/15/2022   PLT 275.0 09/15/2022   NA 138 09/15/2022   K 4.1 09/15/2022   CL 100 09/15/2022   CO2 29 09/15/2022   GLUCOSE 86 09/15/2022   BUN 16 09/15/2022   CREATININE 0.94 09/15/2022   BILITOT 0.6 09/15/2022   ALKPHOS 55 09/15/2022   AST 23 09/15/2022   ALT 30 09/15/2022   PROT 8.0 10/09/2022   ALBUMIN 4.8 09/15/2022   CALCIUM 10.1 10/09/2022    Speciality Comments: No specialty comments available.  Procedures:  No procedures performed Allergies: Patient has no known allergies.   Assessment / Plan:     Visit Diagnoses: Osteoarthritis of cervical spine, unspecified spinal osteoarthritis complication status - Plan: XR Cerv Spine Flex&Ext Only, CANCELED: XR Cervical Spine With Flex & Extend Sedimentation rate elevation - Plan: Sedimentation rate, C-reactive protein  Checking x-ray of cervical spine and flexion-extension for confirmation based on previous x-ray and MRI radiology report to confirm interbody ankylosis.  This does look consistent with no relative positional change appreciable in the C3-C6 levels.  Focal ankylosis and only the cervical spine may be related to previous injury with ligament calcification or could be due to AS just in an limited distribution but with  negative HLA-B27 would not be definite for this. No strong indication for starting any maintenance DMARD treatment for now. Can monitor imaging for any significant radiographic progression or involvement of new areas.  Would recommend conservative approach can take NSAIDs if symptomatic would benefit from additional physical therapy follow-up for evaluation and specific range of motion exercise planning.  Mild elevated sed rate of 34 will repeat sed rate and CRP again today for evidence of systemic inflammation.  Ankylosis of spine - Plan: XR Lumbar Spine 2-3 Views, XR Pelvis 1-2 Views  Checking x-rays of lumbar spine and x-ray of pelvis 2 views AP  and Emelda Fear there are very mild degenerative changes no evidence of inflammatory sacroiliitis or ankylosis of the lumbar spine.  Positive ANA (antinuclear antibody) - Plan: RNP Antibody, Anti-Smith antibody, Anti-DNA antibody, double-stranded, C3 and C4, Centromere Antibodies, Anti-scleroderma antibody  Positive ANA is at moderately high titer but unusual pattern for any systemic autoimmune disease that would account for the current symptoms.  Negative for specific clinical criteria for autoimmune connective tissue disease.  Will check specific antibodies and serum complements today.  Orders: Orders Placed This Encounter  Procedures   XR Lumbar Spine 2-3 Views   XR Pelvis 1-2 Views   XR Cerv Spine Flex&Ext Only   Sedimentation rate   C-reactive protein   RNP Antibody   Anti-Smith antibody   Anti-DNA antibody, double-stranded   C3 and C4   Centromere Antibodies   Anti-scleroderma antibody   No orders of the defined types were placed in this encounter.   Follow-Up Instructions: No follow-ups on file.   Fuller Plan, MD  Note - This record has been created using [employer].  Chart creation errors have been sought, but may not always  have been located. Such creation errors do not reflect on  the standard of medical care.

## 2023-06-04 LAB — RNP ANTIBODY: Ribonucleic Protein(ENA) Antibody, IgG: <1 AI

## 2023-06-04 LAB — C3 AND C4
C3 Complement: 167 mg/dL (ref 82–185)
C4 Complement: 29 mg/dL (ref 15–53)

## 2023-06-04 LAB — ANTI-DNA ANTIBODY, DOUBLE-STRANDED: ds DNA Ab: 2 IU/mL

## 2023-06-04 LAB — ANTI-SCLERODERMA ANTIBODY: Scleroderma (Scl-70) (ENA) Antibody, IgG: <1 AI

## 2023-06-04 LAB — C-REACTIVE PROTEIN: CRP: 3.3 mg/L (ref <8.0)

## 2023-06-04 LAB — ANTI-SMITH ANTIBODY: ENA SM Ab Ser-aCnc: <1 AI

## 2023-06-04 LAB — CENTROMERE ANTIBODIES: Centromere Ab Screen: <1 AI

## 2023-06-04 LAB — SEDIMENTATION RATE: Sed Rate: 11 mm/h (ref 0–20)

## 2023-06-18 ENCOUNTER — Other Ambulatory Visit: Payer: Self-pay | Admitting: Internal Medicine

## 2023-08-27 ENCOUNTER — Other Ambulatory Visit: Payer: Self-pay | Admitting: Internal Medicine

## 2023-09-13 MED ORDER — FENOFIBRATE 160 MG PO TABS: 160.0000 mg | ORAL_TABLET | Freq: Every day | ORAL | 0 refills | Status: DC

## 2023-09-13 NOTE — Addendum Note (Signed)
Addended byConrad [place] D on: 09/13/2023 04:24 PM   Modules accepted: Orders

## 2023-09-13 NOTE — Telephone Encounter (Signed)
30 day supply sent

## 2023-09-13 NOTE — Telephone Encounter (Signed)
Pt called in to request medication. Advised pt an appt was needed and scheduled him for 12.16.24. Pt would like a refill of this medication to get him to this appt.

## 2023-09-16 ENCOUNTER — Other Ambulatory Visit: Payer: Self-pay | Admitting: Internal Medicine

## 2023-09-17 ENCOUNTER — Telehealth: Payer: Self-pay | Admitting: Internal Medicine

## 2023-09-17 MED ORDER — FENOFIBRATE 160 MG PO TABS: 160.0000 mg | ORAL_TABLET | Freq: Every day | ORAL | 0 refills | Status: DC

## 2023-09-17 NOTE — Telephone Encounter (Signed)
Pt said [facility] still has not received the refill from [place] for his fenofibrate. Advised that it was sent on 09/13/23 for him. [employer] shows no record. Pt would like it sent to [facility] on [address] so he can pick it up since he has no more and cannot wait until his appt on 12/16. Please advise pt when refill has been rerouted to [facility].

## 2023-09-17 NOTE — Telephone Encounter (Signed)
Rx sent 

## 2023-09-17 NOTE — Addendum Note (Signed)
Addended byConrad Weston D on: 09/17/2023 09:08 AM   Modules accepted: Orders

## 2023-09-20 ENCOUNTER — Other Ambulatory Visit: Payer: Self-pay | Admitting: Internal Medicine

## 2023-09-20 ENCOUNTER — Encounter: Payer: Self-pay | Admitting: Internal Medicine

## 2023-09-20 ENCOUNTER — Ambulatory Visit: Payer: BC Managed Care – PPO | Admitting: Internal Medicine

## 2023-09-20 VITALS — BP 128/80 | HR 77 | Temp 97.7°F | Resp 18 | Ht 69.5 in | Wt 231.0 lb

## 2023-09-20 DIAGNOSIS — F419 Anxiety disorder, unspecified: Secondary | ICD-10-CM

## 2023-09-20 DIAGNOSIS — Z79899 Other long term (current) drug therapy: Secondary | ICD-10-CM

## 2023-09-20 DIAGNOSIS — Z23 Encounter for immunization: Secondary | ICD-10-CM

## 2023-09-20 DIAGNOSIS — Z0000 Encounter for general adult medical examination without abnormal findings: Secondary | ICD-10-CM | POA: Diagnosis not present

## 2023-09-20 DIAGNOSIS — E781 Pure hyperglyceridemia: Secondary | ICD-10-CM

## 2023-09-20 DIAGNOSIS — E119 Type 2 diabetes mellitus without complications: Secondary | ICD-10-CM | POA: Diagnosis not present

## 2023-09-20 MED ORDER — FENOFIBRATE 160 MG PO TABS: 160.0000 mg | ORAL_TABLET | Freq: Every day | ORAL | 3 refills | Status: DC

## 2023-09-20 NOTE — Patient Instructions (Addendum)
Vaccines I recommend: Covid booster  Proceed with blood work at [facility], across from [facility]  Next visit in 1 year depending on results.   Per our records you are due for your diabetic eye exam. Please contact your eye doctor to schedule an appointment. Please have them send copies of your office visit notes to [place]. Our fax number is [phone]. If you need a referral to an eye doctor please let us know.

## 2023-09-20 NOTE — Progress Notes (Signed)
Subjective:    Patient ID: Robert Soto, male    DOB: [birth year], 57 y.o.   MRN: [id number]  DOS:  09/20/2023 Type of visit - description: CPX  Here for CPX Doing well. Denies any major concerns. Denies headaches Very seldom has red blood on the toilet paper after BM, typically with hard stools. Denies fever chills or weight loss.  Wt Readings from Last 3 Encounters:  09/20/23 231 lb (104.8 kg)  06/03/23 230 lb (104.3 kg)  09/15/22 223 lb 6 oz (101.3 kg)     Review of Systems  Other than above, a 14 point review of systems is negative       Past Medical History:  Diagnosis Date   Allergy    Diabetes mellitus (HCC)    High triglycerides    hx. up to 900s- was treated with vytorin (d/c when he was training for marathon & TG decreased)    Past Surgical History:  Procedure Laterality Date   EYE SURGERY     TONSILLECTOMY  10/05/1974   VASECTOMY  10/05/2010   Social History   Socioeconomic History   Marital status: Married    Spouse name: Not on file   Number of children: 2   Years of education: Not on file   Highest education level: Not on file  Occupational History   Occupation: managment-busines   Tobacco Use   Smoking status: Former    Passive exposure: Never   Smokeless tobacco: Never   Tobacco comments:    early 20's smoked at parties  Vaping Use   Vaping status: Never Used  Substance and Sexual Activity   Alcohol use: Yes    Comment: socially    Drug use: No   Sexual activity: Not on file  Other Topics Concern   Not on file  Social History Narrative   Original from [place]   Social Drivers of Health   Financial Resource Strain: Not on file  Food Insecurity: Not on file  Transportation Needs: Not on file  Physical Activity: Not on file  Stress: Not on file  Social Connections: Unknown (09/17/2022)   [facility]   Social Network    Social Network: Not on file  Intimate Partner Violence: Unknown  (09/17/2022)   Received from [facility], [facility]   HITS    Physically Hurt: Not on file    Insult or Talk Down To: Not on file    Threaten Physical Harm: Not on file    Scream or Curse: Not on file    Current Outpatient Medications  Medication Instructions   ALPRAZolam (XANAX) 0.5 mg, Oral, 2 times daily PRN   aspirin 81 mg, Daily   atorvastatin (LIPITOR) 80 MG tablet Take 1 tablet by mouth at bedtime. Please call provider to schedule an appointment due in June for further refills.   cetirizine (ZYRTEC) 10 mg, Daily   Cholecalciferol (VITAMIN D-3 PO) Take by mouth.   fenofibrate 160 mg, Oral, Daily   fish oil-omega-3 fatty acids 2 g, Daily   MAGNESIUM PO Take by mouth.   vitamin C 1,000 mg, Daily       Objective:   Physical Exam BP 128/80   Pulse 77   Temp 97.7 F (36.5 C) (Oral)   Resp 18   Ht 5' 9.5" (1.765 m)   Wt 231 lb (104.8 kg)   SpO2 99%   BMI 33.62 kg/m  General: Well developed, NAD, BMI noted Neck: No  thyromegaly  HEENT:  Normocephalic . Face symmetric, atraumatic Lungs:  CTA B Normal respiratory effort, no intercostal retractions, no accessory muscle use. Heart: RRR,  no murmur.  Abdomen:  Not distended, soft, non-tender. No rebound or rigidity.   Lower extremities: no pretibial edema bilaterally  Skin: Exposed areas without rash. Not pale. Not jaundice Neurologic:  alert & oriented X3.  Speech normal, gait appropriate for age and unassisted Strength symmetric and appropriate for age.  Psych: Cognition and judgment appear intact.  Cooperative with normal attention span and concentration.  Behavior appropriate. No anxious or depressed appearing.     Assessment    ASSESSMENT Hyperglycemia Dyslipidemia: Triglycerides up to the 1156, usually responding very well to exercise more than diet + FH CAD, father age 7, 81- (-) GTX , Cor Ca+ score: zero 08-2018 Headache: Brain MRI MRI WNL 09/2022 Neck Internal hemorrhoids  PLAN Here  for CPX -Tdap 03/2021 -Shingrix x 2   -  Flu shot today - Recommend COVID booster CCS: Colonoscopy 10-2019, next 5 years per GI letter Prostate ca screening: No symptoms, check PSA Lifestyle: Needs to improve, very limited time to exercise, extensive discussion about diet. Labs: CMP FLP CBC A1c TSH PSA  DM: Last A1c 6.5, early DM, this was discussed with the patient, recheck labs Dyslipidemia: On atorvastatin, fenofibrate, checking labs FH CAD: On aspirin, controlling CV RF, calcium coronary score 2019 negative, discussed the possibility of repeating it, agreed to proceed.. Anxiety: Contract signed, RF as needed. BMI 33, Ozempic? No contraindications noted.  Could consider RTC 1 year, sooner if neede

## 2023-09-21 ENCOUNTER — Encounter: Payer: Self-pay | Admitting: Internal Medicine

## 2023-09-21 NOTE — Assessment & Plan Note (Signed)
Here for CPX  DM: Last A1c 6.5, early DM, this was discussed with the patient, recheck labs Dyslipidemia: On atorvastatin, fenofibrate, checking labs FH CAD: On aspirin, controlling CV RF, calcium coronary score 2019 negative, discussed the possibility of repeating it, agreed to proceed.. Anxiety: Contract signed, RF as needed. BMI 33, Ozempic? No contraindications noted.  Could consider RTC 1 year, sooner if neede

## 2023-09-21 NOTE — Assessment & Plan Note (Signed)
Here for CPX -Tdap 03/2021 -Shingrix x 2   -  Flu shot today - Recommend COVID booster CCS: Colonoscopy 10-2019, next 5 years per GI letter Prostate ca screening: No symptoms, check PSA Lifestyle: Needs to improve, very limited time to exercise, extensive discussion about diet. Labs: CMP FLP CBC A1c TSH PSA

## 2023-09-24 ENCOUNTER — Telehealth (HOSPITAL_BASED_OUTPATIENT_CLINIC_OR_DEPARTMENT_OTHER): Payer: Self-pay | Admitting: Internal Medicine

## 2023-10-07 ENCOUNTER — Other Ambulatory Visit (INDEPENDENT_AMBULATORY_CARE_PROVIDER_SITE_OTHER): Payer: BC Managed Care – PPO

## 2023-10-07 DIAGNOSIS — E781 Pure hyperglyceridemia: Secondary | ICD-10-CM

## 2023-10-07 DIAGNOSIS — Z0000 Encounter for general adult medical examination without abnormal findings: Secondary | ICD-10-CM | POA: Diagnosis not present

## 2023-10-07 DIAGNOSIS — E119 Type 2 diabetes mellitus without complications: Secondary | ICD-10-CM

## 2023-10-07 LAB — CBC WITH DIFFERENTIAL/PLATELET
Basophils Absolute: 0 10*3/uL (ref 0.0–0.1)
Basophils Relative: 0.4 % (ref 0.0–3.0)
Eosinophils Absolute: 0.2 10*3/uL (ref 0.0–0.7)
Eosinophils Relative: 2.8 % (ref 0.0–5.0)
HCT: 44.6 % (ref 39.0–52.0)
Hemoglobin: 15 g/dL (ref 13.0–17.0)
Lymphocytes Relative: 30.8 % (ref 12.0–46.0)
Lymphs Abs: 2 10*3/uL (ref 0.7–4.0)
MCHC: 33.6 g/dL (ref 30.0–36.0)
MCV: 89.1 fL (ref 78.0–100.0)
Monocytes Absolute: 0.7 10*3/uL (ref 0.1–1.0)
Monocytes Relative: 11.2 % (ref 3.0–12.0)
Neutro Abs: 3.5 10*3/uL (ref 1.4–7.7)
Neutrophils Relative %: 54.8 % (ref 43.0–77.0)
Platelets: 246 10*3/uL (ref 150.0–400.0)
RBC: 5.01 Mil/uL (ref 4.22–5.81)
RDW: 12.9 % (ref 11.5–15.5)
WBC: 6.4 10*3/uL (ref 4.0–10.5)

## 2023-10-07 LAB — COMPREHENSIVE METABOLIC PANEL
ALT: 62 U/L — ABNORMAL HIGH (ref 0–53)
AST: 75 U/L — ABNORMAL HIGH (ref 0–37)
Albumin: 4.7 g/dL (ref 3.5–5.2)
Alkaline Phosphatase: 70 U/L (ref 39–117)
BUN: 19 mg/dL (ref 6–23)
CO2: 26 meq/L (ref 19–32)
Calcium: 10.1 mg/dL (ref 8.4–10.5)
Chloride: 101 meq/L (ref 96–112)
Creatinine, Ser: 1.1 mg/dL (ref 0.40–1.50)
GFR: 75.8 mL/min (ref >60.00)
Glucose, Bld: 115 mg/dL — ABNORMAL HIGH (ref 70–99)
Potassium: 4.3 meq/L (ref 3.5–5.1)
Sodium: 139 meq/L (ref 135–145)
Total Bilirubin: 0.6 mg/dL (ref 0.2–1.2)
Total Protein: 7.3 g/dL (ref 6.0–8.3)

## 2023-10-07 LAB — LIPID PANEL
Cholesterol: 232 mg/dL — ABNORMAL HIGH (ref 0–200)
HDL: 49.9 mg/dL (ref >39.00)
LDL Cholesterol: 116 mg/dL — ABNORMAL HIGH (ref 0–99)
NonHDL: 181.76
Total CHOL/HDL Ratio: 5
Triglycerides: 331 mg/dL — ABNORMAL HIGH (ref 0.0–149.0)
VLDL: 66.2 mg/dL — ABNORMAL HIGH (ref 0.0–40.0)

## 2023-10-07 LAB — TSH: TSH: 1.64 u[IU]/mL (ref 0.35–5.50)

## 2023-10-07 LAB — MICROALBUMIN / CREATININE URINE RATIO
Creatinine,U: 125.4 mg/dL
Microalb Creat Ratio: 0.9 mg/g (ref 0.0–30.0)
Microalb, Ur: 1.2 mg/dL (ref 0.0–1.9)

## 2023-10-07 LAB — PSA: PSA: 0.78 ng/mL (ref 0.10–4.00)

## 2023-10-07 LAB — HEMOGLOBIN A1C: Hgb A1c MFr Bld: 6.4 % (ref 4.6–6.5)

## 2023-10-11 ENCOUNTER — Ambulatory Visit (HOSPITAL_BASED_OUTPATIENT_CLINIC_OR_DEPARTMENT_OTHER)
Admission: RE | Admit: 2023-10-11 | Discharge: 2023-10-11 | Disposition: A | Discharge status: Home / Self Care | Payer: Self-pay | Source: Ambulatory Visit | Attending: Internal Medicine | Admitting: Internal Medicine

## 2023-10-11 DIAGNOSIS — E781 Pure hyperglyceridemia: Secondary | ICD-10-CM | POA: Insufficient documentation

## 2023-10-12 MED ORDER — EZETIMIBE 10 MG PO TABS: 10.0000 mg | ORAL_TABLET | Freq: Every day | ORAL | 3 refills | Status: DC

## 2023-11-02 ENCOUNTER — Telehealth: Payer: Self-pay | Admitting: *Deleted

## 2023-11-02 NOTE — Telephone Encounter (Signed)
Pt returning call to nurse

## 2023-11-02 NOTE — Telephone Encounter (Signed)
Received message from Dr Jacinto Halim that patient needs new patient appointment.  I placed call to patient and left message to call office.

## 2023-11-02 NOTE — Telephone Encounter (Signed)
I spoke with patient and scheduled him to see Dr Jacinto Halim on 2/6 at 4:00

## 2023-11-03 NOTE — Telephone Encounter (Signed)
Appointment is at 11:40

## 2023-11-11 ENCOUNTER — Ambulatory Visit: Payer: BC Managed Care – PPO | Attending: Cardiology | Admitting: Cardiology

## 2023-11-11 ENCOUNTER — Encounter: Payer: Self-pay | Admitting: Cardiology

## 2023-11-11 VITALS — BP 118/72 | HR 55 | Resp 16 | Ht 69.0 in | Wt 218.4 lb

## 2023-11-11 DIAGNOSIS — E782 Mixed hyperlipidemia: Secondary | ICD-10-CM

## 2023-11-11 DIAGNOSIS — R931 Abnormal findings on diagnostic imaging of heart and coronary circulation: Secondary | ICD-10-CM

## 2023-11-11 DIAGNOSIS — R739 Hyperglycemia, unspecified: Secondary | ICD-10-CM

## 2023-11-11 DIAGNOSIS — E559 Vitamin D deficiency, unspecified: Secondary | ICD-10-CM

## 2023-11-11 DIAGNOSIS — Z8249 Family history of ischemic heart disease and other diseases of the circulatory system: Secondary | ICD-10-CM

## 2023-11-11 NOTE — Progress Notes (Signed)
 Cardiology Office Note:  .   Date:  11/11/2023  ID:  Robert Soto, DOB 10/27/1965, MRN [id number] PCP: Amon Aloysius BRAVO, MD  [facility] Providers Cardiologist:  Gordy Bergamo, MD   History of Present Illness: .   Robert Soto is a 58 y.o. male with a history of high cholesterol who presents for a cardiology consultation in view of elevated coronary calcium  score. He is accompanied by his wife, Robert Soto.  Discussed the use of AI scribe software for clinical note transcription with the patient, who gave verbal consent to proceed.  History of Present Illness   Robert Soto is a 58 year old male with a history of high cholesterol who presents for a cardiology consultation. He is accompanied by his wife, Robert Soto.  He has a significant family history of heart disease, with his father died with massive MI at age 86 and all twelve siblings having undergone bypass surgeries, stents, or experienced heart attacks. His brother also has high cholesterol, which raises his concern about his own cardiovascular risk.  He has been actively managing his health by losing 19 pounds recently, attributed to reduced travel and increased exercise. He ran a 5K two days ago and has completed multiple 5Ks in January and February. No current issues or pains except for neck stiffness, which he attributes to calcium  deposits.  Frequent work travel impacts his ability to maintain a consistent exercise and diet routine. His eating habits change when traveling, often dining at restaurants with business associates. Despite this, he aims to lose an additional 15-20 pounds in 2025.  He does not smoke cigarettes but occasionally smokes cigars, having had about six to eight cigars last year. He reports minimal alcohol consumption, typically one or two drinks per night when traveling, but none at home unless hosting guests.  He has been fasting intermittently, which he finds beneficial, and recently completed a 24-hour fast. He  monitors his heart rate during exercise, aiming to keep it between 140 and 150 BPM.     Labs   Lab Results  Component Value Date   CHOL 232 (H) 10/07/2023   HDL 49.90 10/07/2023   LDLCALC 116 (H) 10/07/2023   LDLDIRECT 132.0 09/15/2022   TRIG 331.0 (H) 10/07/2023   CHOLHDL 5 10/07/2023   Lab Results  Component Value Date   NA 139 10/07/2023   K 4.3 10/07/2023   CO2 26 10/07/2023   GLUCOSE 115 (H) 10/07/2023   BUN 19 10/07/2023   CREATININE 1.10 10/07/2023   CALCIUM  10.1 10/07/2023   GFR 75.80 10/07/2023   GFRNONAA 105.09 08/13/2010      Latest Ref Rng & Units 10/07/2023    9:22 AM 10/09/2022    9:35 AM 09/15/2022    3:28 PM  BMP  Glucose 70 - 99 mg/dL 884   86   BUN 6 - 23 mg/dL 19   16   Creatinine 9.59 - 1.50 mg/dL 8.89   9.05   Sodium 864 - 145 mEq/L 139   138   Potassium 3.5 - 5.1 mEq/L 4.3   4.1   Chloride 96 - 112 mEq/L 101   100   CO2 19 - 32 mEq/L 26   29   Calcium  8.4 - 10.5 mg/dL 89.8  89.8  89.6       Latest Ref Rng & Units 10/07/2023    9:22 AM 09/15/2022    3:28 PM 05/20/2021   11:09 AM  CBC  WBC 4.0 - 10.5 K/uL 6.4  8.1  7.7   Hemoglobin 13.0 - 17.0 g/dL 84.9  84.8  85.0   Hematocrit 39.0 - 52.0 % 44.6  44.9  43.7   Platelets 150.0 - 400.0 K/uL 246.0  275.0  318.0     Lab Results  Component Value Date   HGBA1C 6.4 10/07/2023    Lab Results  Component Value Date   TSH 1.64 10/07/2023    Review of Systems  Cardiovascular:  Negative for chest pain, dyspnea on exertion and leg swelling.    Physical Exam:   VS:  BP 118/72 (BP Location: Left Arm, Patient Position: Sitting, Cuff Size: Large)   Pulse (!) 55   Resp 16   Ht 5' 9 (1.753 m)   Wt 218 lb 6.4 oz (99.1 kg)   SpO2 99%   BMI 32.25 kg/m    Wt Readings from Last 3 Encounters:  11/11/23 218 lb 6.4 oz (99.1 kg)  09/20/23 231 lb (104.8 kg)  06/03/23 230 lb (104.3 kg)    Physical Exam Constitutional:      Appearance: He is obese.  Neck:     Vascular: No carotid bruit or JVD.   Cardiovascular:     Rate and Rhythm: Normal rate and regular rhythm.     Pulses: Intact distal pulses.     Heart sounds: Normal heart sounds. No murmur heard.    No gallop.  Pulmonary:     Effort: Pulmonary effort is normal.     Breath sounds: Normal breath sounds.  Abdominal:     General: Bowel sounds are normal.     Palpations: Abdomen is soft.  Musculoskeletal:     Right lower leg: No edema.     Left lower leg: No edema.    Studies Reviewed: .    CT CARDIAC SCORING 10/11/2023  Left main: 0  Left anterior descending artery: 113 Left circumflex artery: 84.2  Right coronary artery: 0  Total: 197 MESA Percentile: 88  Pericardium: Normal.  Ascending Aorta: Normal caliber. Compared to 08/30/2018, coronary calcium  score was 0.  EKG:    EKG Interpretation Date/Time:  Thursday November 11 2023 11:56:53 EST Ventricular Rate:  55 PR Interval:  194 QRS Duration:  94 QT Interval:  438 QTC Calculation: 419 R Axis:   10  Text Interpretation: EKG 11/11/2023: Normal sinus rhythm at rate of 55 bpm, normal EKG. Confirmed by Riannon Mukherjee, Jagadeesh ([id number]) on 11/11/2023 12:29:48 PM    Medications and allergies    No Known Allergies   Current Outpatient Medications:    ALPRAZolam  (XANAX ) 0.5 MG tablet, Take 1 tablet (0.5 mg total) by mouth 2 (two) times daily as needed for anxiety., Disp: 60 tablet, Rfl: 0   Ascorbic Acid (VITAMIN C) 1000 MG tablet, Take 1,000 mg by mouth daily., Disp: , Rfl:    aspirin 81 MG tablet, Take 81 mg by mouth daily., Disp: , Rfl:    atorvastatin  (LIPITOR) 80 MG tablet, Take 1 tablet by mouth at bedtime. Please call provider to schedule an appointment due in June for further refills., Disp: 90 tablet, Rfl: 2   cetirizine (ZYRTEC) 10 MG tablet, Take 10 mg by mouth daily. In the cold weather, Disp: , Rfl:    Cholecalciferol (VITAMIN D -3 PO), Take by mouth., Disp: , Rfl:    ezetimibe  (ZETIA ) 10 MG tablet, Take 1 tablet (10 mg total) by mouth daily., Disp: 90 tablet,  Rfl: 3   fenofibrate  160 MG tablet, Take 1 tablet (160 mg total) by mouth daily., Disp: 90 tablet, Rfl:  3   fish oil-omega-3 fatty acids 1000 MG capsule, Take 2 g by mouth daily. , Disp: , Rfl:    MAGNESIUM PO, Take by mouth., Disp: , Rfl:    ASSESSMENT AND PLAN: .      ICD-10-CM   1. Elevated coronary artery calcium  score 10/11/2023: Total: 197 MESA Percentile: 88  R93.1 EKG 12-Lead    Lipoprotein A (LPA)    EXERCISE TOLERANCE TEST (ETT)    Cardiac Stress Test: Informed Consent Details: Physician/Practitioner Attestation; Transcribe to consent form and obtain patient signature    2. Mixed hyperlipidemia  E78.2 Lipoprotein A (LPA)    EXERCISE TOLERANCE TEST (ETT)    Cardiac Stress Test: Informed Consent Details: Physician/Practitioner Attestation; Transcribe to consent form and obtain patient signature    3. Family history of premature CAD  Z82.49 EXERCISE TOLERANCE TEST (ETT)    4. Hyperglycemia  R73.9 EXERCISE TOLERANCE TEST (ETT)    5. Hypovitaminosis D  E55.9 VITAMIN D  25 Hydroxy (Vit-D Deficiency, Fractures)    EXERCISE TOLERANCE TEST (ETT)     1. Elevated coronary artery calcium  score 10/11/2023: Total: 197 MESA Percentile: 88 Increased coronary calcium  score from 0 in 2019 to 197. Currently asymptomatic but concerned about potential future cardiac events.  Discussed the importance of cholesterol-lowering medications (atorvastatin  and ezetimibe ) and lifestyle modifications. Emphasized that medications can significantly reduce but not eliminate the risk of major cardiac events.  Discussed recognizing symptoms like chest discomfort during exercise and seeking medical attention. Encouraged continued exercise and dietary modifications. Although his coronary calcium  score is elevated, he just completed 3K run 2 days ago without any symptoms of angina.  So far he has completed about 6 marathons including anywhere from 3-5 K walks at 12 to 13 minutes,.  No symptoms of chest pain or  dyspnea.  - Order treadmill stress test to evaluate for EKG abnormalities during exercise - Order blood work to check LPA levels - Continue atorvastatin  and ezetimibe  - Encourage weight loss of 15-20 pounds - Advise on maintaining a healthy diet and regular exercise - Discuss the importance of avoiding cigars and limiting alcohol intake -Check lipids as per his PCP in 3-6 months  2. Mixed hyperlipidemia Check Lp(a). No changes in his appropriate management presently on including tolerating milligrams daily, ezetimibe  10 mg daily that was recently added along with fenofibrate  160 mg daily and he is also on fish oil omega-3 1000 mg 2 capsules a day. Suspect with lifestyle modification and weight loss his lipids can be normalized.  3. Hyperglycemia Also contributing to his risk, weight loss discussed with the patient.  4. General Health Maintenance Significant lifestyle changes, including weight loss and increased physical activity. Discussed the importance of regular health screenings and maintaining a healthy lifestyle. - Order vitamin D  level check - Encourage continued fasting and healthy eating habits - Schedule follow-up in 6 months to monitor progress  Signed,  Gordy Bergamo, MD, [facility] [address] 11/11/2023, 4:59 PM [facility] [address] [place], [place] [address] Phone: [phone]. Fax:  [phone]

## 2023-11-11 NOTE — Patient Instructions (Signed)
 Medication Instructions:  Your physician recommends that you continue on your current medications as directed. Please refer to the Current Medication list given to you today.  *If you need a refill on your cardiac medications before your next appointment, please call your pharmacy*  Lab Work: TODAY: VitD and LPa  Testing/Procedures: Exercise Stress Test  Your physician has requested that you have an exercise tolerance test. For further information please visit [email or web address] Please also follow instruction sheet, as given.  Follow-Up: At [facility], you and your health needs are our priority.  As part of our continuing mission to provide you with exceptional heart care, we have created designated Provider Care Teams.  These Care Teams include your primary Cardiologist (physician) and Advanced Practice Providers (APPs -  Physician Assistants and Nurse Practitioners) who all work together to provide you with the care you need, when you need it.  Your next appointment:   6 months  Provider:   Gordy Bergamo, MD

## 2023-11-12 ENCOUNTER — Encounter: Payer: Self-pay | Admitting: Cardiology

## 2023-11-12 LAB — VITAMIN D 25 HYDROXY (VIT D DEFICIENCY, FRACTURES): Vit D, 25-Hydroxy: 29.9 ng/mL — ABNORMAL LOW (ref 30.0–100.0)

## 2023-11-12 LAB — LIPOPROTEIN A (LPA): Lipoprotein (a): 214.7 nmol/L — ABNORMAL HIGH (ref <75.0)

## 2023-11-14 NOTE — Progress Notes (Signed)
 Lp(a) is markedly elevated.  Will evaluate and see if he would frequently need for clinical trials and primary prevention

## 2023-11-15 NOTE — Progress Notes (Signed)
 Agree, his baseline LDL is not that high to state familial although strong family history of CAD

## 2023-11-15 NOTE — Progress Notes (Signed)
 I do believe he has familial hyperlipidemia as all his family including his brothers and father have elevated lipids. We can explore, remind me

## 2023-12-21 ENCOUNTER — Ambulatory Visit: Attending: Cardiology

## 2023-12-21 DIAGNOSIS — R739 Hyperglycemia, unspecified: Secondary | ICD-10-CM | POA: Diagnosis not present

## 2023-12-21 DIAGNOSIS — R931 Abnormal findings on diagnostic imaging of heart and coronary circulation: Secondary | ICD-10-CM | POA: Diagnosis not present

## 2023-12-21 DIAGNOSIS — Z8249 Family history of ischemic heart disease and other diseases of the circulatory system: Secondary | ICD-10-CM | POA: Diagnosis not present

## 2023-12-21 DIAGNOSIS — E782 Mixed hyperlipidemia: Secondary | ICD-10-CM

## 2023-12-21 DIAGNOSIS — E559 Vitamin D deficiency, unspecified: Secondary | ICD-10-CM

## 2023-12-22 ENCOUNTER — Encounter: Payer: Self-pay | Admitting: Cardiology

## 2023-12-22 LAB — EXERCISE TOLERANCE TEST
Angina Index: 0
Base ST Depression (mm): 0 mm
Duke Treadmill Score: 13
Estimated workload: 15.1
Exercise duration (min): 13 min
Exercise duration (sec): 0 s
MPHR: 165 {beats}/min
Peak HR: 157 {beats}/min
Percent HR: 95 %
RPE: 17
Rest HR: 64 {beats}/min
ST Depression (mm): 0 mm

## 2023-12-22 NOTE — Progress Notes (Signed)
 Excellent effort, no abnormalities noted, normal blood pressure response.

## 2024-02-23 ENCOUNTER — Encounter: Payer: Self-pay | Admitting: Internal Medicine

## 2024-02-23 ENCOUNTER — Other Ambulatory Visit: Payer: Self-pay

## 2024-02-23 DIAGNOSIS — E119 Type 2 diabetes mellitus without complications: Secondary | ICD-10-CM

## 2024-02-23 DIAGNOSIS — E781 Pure hyperglyceridemia: Secondary | ICD-10-CM

## 2024-04-06 ENCOUNTER — Other Ambulatory Visit (INDEPENDENT_AMBULATORY_CARE_PROVIDER_SITE_OTHER)

## 2024-04-06 DIAGNOSIS — E119 Type 2 diabetes mellitus without complications: Secondary | ICD-10-CM | POA: Diagnosis not present

## 2024-04-06 DIAGNOSIS — E781 Pure hyperglyceridemia: Secondary | ICD-10-CM | POA: Diagnosis not present

## 2024-04-06 LAB — LIPID PANEL
Cholesterol: 153 mg/dL (ref 0–200)
HDL: 49.4 mg/dL (ref >39.00)
LDL Cholesterol: 82 mg/dL (ref 0–99)
NonHDL: 103.76
Total CHOL/HDL Ratio: 3
Triglycerides: 111 mg/dL (ref 0.0–149.0)
VLDL: 22.2 mg/dL (ref 0.0–40.0)

## 2024-04-06 LAB — AST: AST: 21 U/L (ref 0–37)

## 2024-04-06 LAB — HEMOGLOBIN A1C: Hgb A1c MFr Bld: 6.4 % (ref 4.6–6.5)

## 2024-04-06 LAB — ALT: ALT: 25 U/L (ref 0–53)

## 2024-04-07 ENCOUNTER — Ambulatory Visit: Payer: Self-pay | Admitting: Internal Medicine

## 2024-04-07 NOTE — Progress Notes (Signed)
 Thank you so much for the update.  Excellent results. You had told me that you would make lifestyle changes also and it shows!! Very pleased and marked change. This will help you tremendously in the long run.

## 2024-04-11 DIAGNOSIS — M9908 Segmental and somatic dysfunction of rib cage: Secondary | ICD-10-CM | POA: Diagnosis not present

## 2024-04-11 DIAGNOSIS — M9902 Segmental and somatic dysfunction of thoracic region: Secondary | ICD-10-CM | POA: Diagnosis not present

## 2024-04-11 DIAGNOSIS — M9901 Segmental and somatic dysfunction of cervical region: Secondary | ICD-10-CM | POA: Diagnosis not present

## 2024-04-11 DIAGNOSIS — M6240 Contracture of muscle, unspecified site: Secondary | ICD-10-CM | POA: Diagnosis not present

## 2024-04-18 DIAGNOSIS — M9908 Segmental and somatic dysfunction of rib cage: Secondary | ICD-10-CM | POA: Diagnosis not present

## 2024-04-18 DIAGNOSIS — M6240 Contracture of muscle, unspecified site: Secondary | ICD-10-CM | POA: Diagnosis not present

## 2024-04-18 DIAGNOSIS — M9902 Segmental and somatic dysfunction of thoracic region: Secondary | ICD-10-CM | POA: Diagnosis not present

## 2024-04-25 DIAGNOSIS — M6240 Contracture of muscle, unspecified site: Secondary | ICD-10-CM | POA: Diagnosis not present

## 2024-04-25 DIAGNOSIS — M9902 Segmental and somatic dysfunction of thoracic region: Secondary | ICD-10-CM | POA: Diagnosis not present

## 2024-04-25 DIAGNOSIS — M9908 Segmental and somatic dysfunction of rib cage: Secondary | ICD-10-CM | POA: Diagnosis not present

## 2024-04-27 ENCOUNTER — Other Ambulatory Visit: Payer: Self-pay | Admitting: Internal Medicine

## 2024-05-09 DIAGNOSIS — M6240 Contracture of muscle, unspecified site: Secondary | ICD-10-CM | POA: Diagnosis not present

## 2024-05-09 DIAGNOSIS — M9902 Segmental and somatic dysfunction of thoracic region: Secondary | ICD-10-CM | POA: Diagnosis not present

## 2024-05-09 DIAGNOSIS — M9908 Segmental and somatic dysfunction of rib cage: Secondary | ICD-10-CM | POA: Diagnosis not present

## 2024-07-05 NOTE — Progress Notes (Signed)
 Robert Soto                                          MRN: [id number]   07/05/2024   The [facility] Quality Team Specialist reviewed this patient medical record for the purposes of chart review for care gap closure. The following were reviewed: chart review for care gap closure-kidney health evaluation for diabetes:eGFR  and uACR. Patient needs uACR completed for gap closure.    [facility]BCI Quality Team

## 2024-07-16 ENCOUNTER — Other Ambulatory Visit: Payer: Self-pay | Admitting: Internal Medicine

## 2024-07-16 MED ORDER — AMOXICILLIN 875 MG PO TABS: 875.0000 mg | ORAL_TABLET | Freq: Two times a day (BID) | ORAL | 0 refills | Status: DC

## 2024-07-16 NOTE — Progress Notes (Signed)
 Spoke w/ pt, respiratory sxs for 6 days, had fever- resolved; +cough w/ greenish sputum, no CP-SOB. Will send amoxicillin 

## 2024-08-30 ENCOUNTER — Other Ambulatory Visit: Payer: Self-pay | Admitting: Internal Medicine

## 2024-09-21 ENCOUNTER — Other Ambulatory Visit: Payer: Self-pay | Admitting: Internal Medicine

## 2024-10-18 ENCOUNTER — Encounter: Payer: Self-pay | Admitting: Internal Medicine

## 2024-10-18 ENCOUNTER — Encounter: Payer: Self-pay | Admitting: Pharmacist

## 2024-10-18 ENCOUNTER — Ambulatory Visit: Admitting: Internal Medicine

## 2024-10-18 VITALS — BP 118/78 | HR 58 | Temp 97.8°F | Resp 16 | Ht 69.0 in | Wt 215.2 lb

## 2024-10-18 DIAGNOSIS — E781 Pure hyperglyceridemia: Secondary | ICD-10-CM | POA: Diagnosis not present

## 2024-10-18 DIAGNOSIS — Z23 Encounter for immunization: Secondary | ICD-10-CM | POA: Diagnosis not present

## 2024-10-18 DIAGNOSIS — E119 Type 2 diabetes mellitus without complications: Secondary | ICD-10-CM | POA: Diagnosis not present

## 2024-10-18 DIAGNOSIS — Z0000 Encounter for general adult medical examination without abnormal findings: Secondary | ICD-10-CM | POA: Diagnosis not present

## 2024-10-18 DIAGNOSIS — Z1211 Encounter for screening for malignant neoplasm of colon: Secondary | ICD-10-CM

## 2024-10-18 LAB — LIPID PANEL
Cholesterol: 143 mg/dL (ref 28–200)
HDL: 50.7 mg/dL
LDL Cholesterol: 70 mg/dL (ref 10–99)
NonHDL: 92.1
Total CHOL/HDL Ratio: 3
Triglycerides: 112 mg/dL (ref 10.0–149.0)
VLDL: 22.4 mg/dL (ref 0.0–40.0)

## 2024-10-18 LAB — PSA: PSA: 0.7 ng/mL (ref 0.10–4.00)

## 2024-10-18 LAB — MICROALBUMIN / CREATININE URINE RATIO
Creatinine,U: 36 mg/dL
Microalb Creat Ratio: UNDETERMINED mg/g (ref 0.0–30.0)
Microalb, Ur: <0.7 mg/dL

## 2024-10-18 LAB — CBC WITH DIFFERENTIAL/PLATELET
Basophils Absolute: 0 K/uL (ref 0.0–0.1)
Basophils Relative: 0.8 % (ref 0.0–3.0)
Eosinophils Absolute: 0.2 K/uL (ref 0.0–0.7)
Eosinophils Relative: 3.2 % (ref 0.0–5.0)
HCT: 43.1 % (ref 39.0–52.0)
Hemoglobin: 14.5 g/dL (ref 13.0–17.0)
Lymphocytes Relative: 33 % (ref 12.0–46.0)
Lymphs Abs: 1.8 K/uL (ref 0.7–4.0)
MCHC: 33.7 g/dL (ref 30.0–36.0)
MCV: 88.6 fl (ref 78.0–100.0)
Monocytes Absolute: 0.6 K/uL (ref 0.1–1.0)
Monocytes Relative: 11.7 % (ref 3.0–12.0)
Neutro Abs: 2.8 K/uL (ref 1.4–7.7)
Neutrophils Relative %: 51.3 % (ref 43.0–77.0)
Platelets: 247 K/uL (ref 150.0–400.0)
RBC: 4.86 Mil/uL (ref 4.22–5.81)
RDW: 12.8 % (ref 11.5–15.5)
WBC: 5.4 K/uL (ref 4.0–10.5)

## 2024-10-18 LAB — COMPREHENSIVE METABOLIC PANEL WITH GFR
ALT: 29 U/L (ref 3–53)
AST: 22 U/L (ref 5–37)
Albumin: 5 g/dL (ref 3.5–5.2)
Alkaline Phosphatase: 55 U/L (ref 39–117)
BUN: 15 mg/dL (ref 6–23)
CO2: 29 meq/L (ref 19–32)
Calcium: 9.7 mg/dL (ref 8.4–10.5)
Chloride: 103 meq/L (ref 96–112)
Creatinine, Ser: 0.97 mg/dL (ref 0.40–1.50)
GFR: 87.51 mL/min
Glucose, Bld: 91 mg/dL (ref 70–99)
Potassium: 4.2 meq/L (ref 3.5–5.1)
Sodium: 138 meq/L (ref 135–145)
Total Bilirubin: 0.7 mg/dL (ref 0.2–1.2)
Total Protein: 7.6 g/dL (ref 6.0–8.3)

## 2024-10-18 LAB — HEMOGLOBIN A1C: Hgb A1c MFr Bld: 6.1 % (ref 4.6–6.5)

## 2024-10-18 MED ORDER — SILDENAFIL CITRATE 20 MG PO TABS: 60.0000 mg | ORAL_TABLET | Freq: Every evening | ORAL | 3 refills | Status: AC | PRN

## 2024-10-18 NOTE — Progress Notes (Signed)
 "  Subjective:    Patient ID: Oneil Behney, male    DOB: Jun 22, 1966, 59 y.o.   MRN: [id number]  DOS:  10/18/2024 CPX  Here for CPX. Feeling well. Over the last year has noted that from time to time he sees drops of red blood when he has a bowel movement, only when she wipes.  Denies pain. No nausea or vomiting.  No abdominal pain.  No LUTS. Occasional ED.  Wt Readings from Last 3 Encounters:  10/18/24 215 lb 4 oz (97.6 kg)  11/11/23 218 lb 6.4 oz (99.1 kg)  09/20/23 231 lb (104.8 kg)   Review of Systems  Other than above, a 14 point review of systems is negative    Past Medical History:  Diagnosis Date   Allergy    Diabetes mellitus (HCC)    High triglycerides    hx. up to 900s- was treated with vytorin (d/c when he was training for marathon & TG decreased)    Past Surgical History:  Procedure Laterality Date   EYE SURGERY     TONSILLECTOMY  10/05/1974   VASECTOMY  10/05/2010    Current Outpatient Medications  Medication Instructions   ALPRAZolam  (XANAX ) 0.5 mg, Oral, 2 times daily PRN   amoxicillin  (AMOXIL ) 875 mg, Oral, 2 times daily   aspirin 81 mg, Daily   atorvastatin  (LIPITOR) 80 mg, Oral, Daily at bedtime   cetirizine (ZYRTEC) 10 mg, Daily   Cholecalciferol (VITAMIN D -3 PO) Take by mouth.   ezetimibe  (ZETIA ) 10 mg, Oral, Daily   fenofibrate  160 mg, Oral, Daily   fish oil-omega-3 fatty acids 2 g, Daily   MAGNESIUM PO Take by mouth.   vitamin C 1,000 mg, Daily       Objective:   Physical Exam BP 118/78   Pulse (!) 58   Temp 97.8 F (36.6 C) (Oral)   Resp 16   Ht 5' 9 (1.753 m)   Wt 215 lb 4 oz (97.6 kg)   SpO2 98%   BMI 31.79 kg/m  General: Well developed, NAD, BMI noted Neck: No  thyromegaly  HEENT:  Normocephalic . Face symmetric, atraumatic Lungs:  CTA B Normal respiratory effort, no intercostal retractions, no accessory muscle use. Heart: RRR,  no murmur.  Abdomen:  Not distended, soft, non-tender. No rebound or rigidity.   Lower  extremities: no pretibial edema bilaterally  Skin: Exposed areas without rash. Not pale. Not jaundice Neurologic:  alert & oriented X3.  Speech normal, gait appropriate for age and unassisted Strength symmetric and appropriate for age.  Psych: Cognition and judgment appear intact.  Cooperative with normal attention span and concentration.  Behavior appropriate. No anxious or depressed appearing.     Assessment    ASSESSMENT Hyperglycemia Dyslipidemia: Triglycerides up to the 1156, usually responding very well to exercise more than diet + FH CAD, father age 34 --2011- (-) GTX --Cor Ca+ score: zero 08-2018 --Coronary calcium  score 2025: Increased to 197, saw cardiology, + LP(a), neg stress test, Rx risk modification. Headache: Brain MRA WNL 09/2022 Neck DJD. Internal hemorrhoids CT chest 2025, incidental 3 mm nodule, no follow-up if low risk  PLAN Here for CPX -Tdap 03/2021 -Shingrix x 2   -  Flu shot today - Rec: COVID-vaccine. CCS: Colonoscopy 10-2019, next 5 years per GI letter.  GI referral Prostate ca screening: No symptoms, check PSA Lifestyle: + Weight gain in the last semester of 2025, he went back to a much healthier lifestyle in the last 2 weeks and he  is already losing weight. Labs: CMP FLP CBC A1c micro PSA hep B serology  Other issues: DM: Patient is aware at some point his A1c was 6.5.  I explained the patient what the A1c means and in his case the treatment is lifestyle modification.  Checking labs, further advised results. Also, provided CGM by my pharmacist. FH CAD: Last year, had a repeated coronary calcium  score, it changed from 0 (2019) to 197. Subsequently, saw cardiology, LPA elevated at 214.   Exercise stress test March 2025: Normal. Was rec continue with risk modification Hyperlipidemia: On atorvastatin , Zetia , fenofibrate 's.  Checking labs. ED: Occasional ED, interested in treatment, prescribed sildenafil , advised about side effects, what to expect.   Interaction with NTG etc. Stress: Has a very busy life, no anxiety or depression at all.  Counseled. RTC 6 months.       "

## 2024-10-18 NOTE — Assessment & Plan Note (Signed)
 Here for CPX Other issues: DM: Patient is aware at some point his A1c was 6.5.  I explained the patient what the A1c means and in his case the treatment is lifestyle modification.  Checking labs, further advised results. Also, provided CGM by my pharmacist. FH CAD: Last year, had a repeated coronary calcium  score, it changed from 0 (2019) to 197. Subsequently, saw cardiology, LPA elevated at 214.   Exercise stress test March 2025: Normal. Was rec continue with risk modification Hyperlipidemia: On atorvastatin , Zetia , fenofibrate 's.  Checking labs. ED: Occasional ED, interested in treatment, prescribed sildenafil , advised about side effects, what to expect.  Interaction with NTG etc. Stress: Has a very busy life, no anxiety or depression at all.  Counseled. RTC 6 months.

## 2024-10-18 NOTE — Patient Instructions (Addendum)
 Please read your instructions carefully.   GO TO THE LAB :  Get the blood work    Go to the front desk for the checkout Please make an appointment in 6 months for a checkup   Please talk with our clinical pharmacist about CGM  You got your flu shot today, please consider COVID-vaccine.

## 2024-10-18 NOTE — Assessment & Plan Note (Signed)
 Here for CPX -Tdap 03/2021 -Shingrix x 2   -  Flu shot today - Rec: COVID-vaccine. CCS: Colonoscopy 10-2019, next 5 years per GI letter.  GI referral Prostate ca screening: No symptoms, check PSA Lifestyle: + Weight gain in the last semester of 2025, he went back to a much healthier lifestyle in the last 2 weeks and he is already losing weight. Labs: CMP FLP CBC A1c micro PSA hep B serology

## 2024-10-18 NOTE — Progress Notes (Signed)
" ° °  10/18/2024 Name: Robert Soto MRN: [id number] DOB: Jun 22, 1966  Chief Complaint  Patient presents with   Medication Management    CGM    Robert Soto is a 59 y.o. year old male who was referred for medication management by their primary care provider, Robert Aloysius BRAVO, MD. They presented for a face to face visit today he was seen in office by PCP today as well.    They were referred to the pharmacist by their PCP for assistance in managing education and start Continuous Glucose Monitor.     Subjective: I was asked by PCP to assist patient with setting up and starting Continuous Glucose Monitor today while in the office.   Medication Access/Adherence  Current Pharmacy:  [id number] [place], [place] - [address] AT [facility] [address] & [facility] [address] [place] [address] Phone: [phone] Fax: [phone]  [email or web address] - [place], [place] - [address] [place] [address] Phone: [phone] Fax: [phone]    Objective:  Lab Results  Component Value Date   HGBA1C 6.4 04/06/2024    Lab Results  Component Value Date   CREATININE 1.10 10/07/2023   BUN 19 10/07/2023   NA 139 10/07/2023   K 4.3 10/07/2023   CL 101 10/07/2023   CO2 26 10/07/2023    Lab Results  Component Value Date   CHOL 153 04/06/2024   HDL 49.40 04/06/2024   LDLCALC 82 04/06/2024   LDLDIRECT 132.0 09/15/2022   TRIG 111.0 04/06/2024   CHOLHDL 3 04/06/2024    Current Outpatient Medications  Medication Instructions   ALPRAZolam  (XANAX ) 0.5 mg, Oral, 2 times daily PRN   aspirin 81 mg, Daily   atorvastatin  (LIPITOR) 80 mg, Oral, Daily at bedtime   cetirizine (ZYRTEC) 10 mg, Daily   Cholecalciferol (VITAMIN D -3 PO) Take by mouth.   ezetimibe  (ZETIA ) 10 mg, Oral, Daily   fenofibrate  160 mg, Oral, Daily   fish oil-omega-3 fatty acids 2 g, Daily   MAGNESIUM PO Take by mouth.    sildenafil  (REVATIO ) 60-80 mg, Oral, At bedtime PRN   vitamin C 1,000 mg, Daily     Assessment/Plan:   Provided patient with #2 Libre 3 plus sensor samples.  Patient received the following instruction for Freestyle Libre 3 Personal CGM:   - preparation of placement site - clean with alcohol and allow to dry.  Sensor is to only be place on back of upper arm.  Patient to rotate sides and site.   -care of sensor and site   - reminded that sensor is waterproof up to 3 feet and for 30 minutes.   - Assisted in downloading [employer] app to his phone and with app / account set up.   - reviewed Libre 3 app home screen and how to read and respond to trend arrows.   - reminded that when magnifying glass symbols shows up she is to confirm BG with finger stick before making any treatment decisions. .   - pt signed up for libre view in office. Linked with PCP office.    Madelin Ray, PharmD Clinical Pharmacist [facility]   "

## 2024-10-19 LAB — HEPATITIS B SURFACE ANTIBODY,QUALITATIVE: Hep B S Ab: REACTIVE — AB

## 2024-10-19 LAB — HEPATITIS B SURFACE ANTIGEN: Hepatitis B Surface Ag: NONREACTIVE

## 2024-10-20 ENCOUNTER — Ambulatory Visit: Payer: Self-pay | Admitting: Internal Medicine

## 2024-10-23 ENCOUNTER — Encounter: Payer: Self-pay | Admitting: Internal Medicine

## 2024-12-04 ENCOUNTER — Encounter

## 2024-12-12 ENCOUNTER — Encounter: Admitting: Internal Medicine

## 2025-04-17 ENCOUNTER — Ambulatory Visit: Admitting: Internal Medicine
